# Patient Record
Sex: Male | Born: 1955
Health system: Southern US, Community
[De-identification: ages and names within clinical notes are randomized; demographics above are authoritative.]

## PROBLEM LIST (undated history)

## (undated) DIAGNOSIS — F419 Anxiety disorder, unspecified: Secondary | ICD-10-CM

## (undated) DIAGNOSIS — H9190 Unspecified hearing loss, unspecified ear: Secondary | ICD-10-CM

## (undated) DIAGNOSIS — C801 Malignant (primary) neoplasm, unspecified: Secondary | ICD-10-CM

## (undated) HISTORY — DX: Anxiety disorder, unspecified: F41.9

## (undated) HISTORY — PX: TONSILLECTOMY: SHX5217

## (undated) HISTORY — DX: Unspecified hearing loss, unspecified ear: H91.90

## (undated) HISTORY — DX: Malignant (primary) neoplasm, unspecified: C80.1

## (undated) HISTORY — PX: APPENDECTOMY: SHX54

## (undated) HISTORY — PX: EYE SURGERY: SHX253

---

## 1999-03-20 ENCOUNTER — Ambulatory Visit (HOSPITAL_COMMUNITY): Admission: RE | Admit: 1999-03-20 | Discharge: 1999-03-20 | Payer: Self-pay | Admitting: Neurosurgery

## 1999-03-20 ENCOUNTER — Encounter: Payer: Self-pay | Admitting: Neurosurgery

## 1999-03-24 ENCOUNTER — Inpatient Hospital Stay (HOSPITAL_COMMUNITY): Admission: RE | Admit: 1999-03-24 | Discharge: 1999-03-28 | Payer: Self-pay | Admitting: Neurosurgery

## 1999-05-18 ENCOUNTER — Ambulatory Visit (HOSPITAL_COMMUNITY): Admission: RE | Admit: 1999-05-18 | Discharge: 1999-05-18 | Payer: Self-pay

## 1999-05-22 ENCOUNTER — Encounter: Payer: Self-pay | Admitting: Neurosurgery

## 1999-05-22 ENCOUNTER — Ambulatory Visit (HOSPITAL_COMMUNITY): Admission: RE | Admit: 1999-05-22 | Discharge: 1999-05-22 | Payer: Self-pay | Admitting: Neurosurgery

## 1999-06-01 ENCOUNTER — Encounter: Admission: RE | Admit: 1999-06-01 | Discharge: 1999-08-30 | Payer: Self-pay | Admitting: Radiation Oncology

## 1999-09-05 ENCOUNTER — Ambulatory Visit (HOSPITAL_COMMUNITY): Admission: RE | Admit: 1999-09-05 | Discharge: 1999-09-05 | Payer: Self-pay | Admitting: Neurosurgery

## 1999-09-05 ENCOUNTER — Encounter: Payer: Self-pay | Admitting: Neurosurgery

## 1999-11-13 ENCOUNTER — Ambulatory Visit (HOSPITAL_COMMUNITY): Admission: RE | Admit: 1999-11-13 | Discharge: 1999-11-13 | Payer: Self-pay | Admitting: Neurosurgery

## 1999-11-13 ENCOUNTER — Encounter: Payer: Self-pay | Admitting: Neurosurgery

## 1999-11-28 ENCOUNTER — Encounter: Admission: RE | Admit: 1999-11-28 | Discharge: 1999-11-28 | Payer: Self-pay | Admitting: Neurosurgery

## 1999-11-28 ENCOUNTER — Encounter: Payer: Self-pay | Admitting: Neurosurgery

## 2000-01-11 ENCOUNTER — Ambulatory Visit (HOSPITAL_COMMUNITY): Admission: RE | Admit: 2000-01-11 | Discharge: 2000-01-11 | Payer: Self-pay | Admitting: Neurosurgery

## 2000-01-11 ENCOUNTER — Encounter: Payer: Self-pay | Admitting: Neurosurgery

## 2000-04-10 ENCOUNTER — Ambulatory Visit (HOSPITAL_COMMUNITY): Admission: RE | Admit: 2000-04-10 | Discharge: 2000-04-10 | Payer: Self-pay | Admitting: Neurosurgery

## 2000-04-10 ENCOUNTER — Encounter: Payer: Self-pay | Admitting: Neurosurgery

## 2000-10-14 ENCOUNTER — Encounter: Payer: Self-pay | Admitting: Neurosurgery

## 2000-10-14 ENCOUNTER — Ambulatory Visit: Admission: RE | Admit: 2000-10-14 | Discharge: 2000-10-14 | Payer: Self-pay | Admitting: Neurosurgery

## 2001-03-31 ENCOUNTER — Encounter: Payer: Self-pay | Admitting: Neurosurgery

## 2001-03-31 ENCOUNTER — Ambulatory Visit (HOSPITAL_COMMUNITY): Admission: RE | Admit: 2001-03-31 | Discharge: 2001-03-31 | Payer: Self-pay | Admitting: Neurosurgery

## 2006-07-10 ENCOUNTER — Ambulatory Visit (HOSPITAL_BASED_OUTPATIENT_CLINIC_OR_DEPARTMENT_OTHER): Admission: RE | Admit: 2006-07-10 | Discharge: 2006-07-10 | Payer: Self-pay | Admitting: Ophthalmology

## 2010-01-20 ENCOUNTER — Encounter: Admission: RE | Admit: 2010-01-20 | Discharge: 2010-01-20 | Payer: Self-pay | Admitting: Neurosurgery

## 2010-05-05 ENCOUNTER — Encounter: Admission: RE | Admit: 2010-05-05 | Discharge: 2010-05-05 | Payer: Self-pay | Admitting: Neurosurgery

## 2011-01-12 ENCOUNTER — Other Ambulatory Visit: Payer: Self-pay | Admitting: Dermatology

## 2011-01-12 NOTE — Op Note (Signed)
NAMEEDUARD, PENKALA               ACCOUNT NO.:  [id number]   MEDICAL RECORD NO.:  [id number]          PATIENT TYPE:  AMB   LOCATION:  [facility]                         FACILITY:  [facility]   PHYSICIAN:  Tyrone Apple. Karleen Hampshire, M.D.DATE OF BIRTH:  09-17-1955   DATE OF PROCEDURE:  07/10/2006  DATE OF DISCHARGE:                                 OPERATIVE REPORT   PREOPERATIVE DIAGNOSIS:  Status post multiple cranial nerve palsy secondary  to a cavernous sinus meningioma, which was treated with the Gamma Knife  excision.  The patient was left with a residual right III nerve paresis,  right IV nerve paresis, and right V nerve paresis.  The V and VII nerve  paresis have resolved.  Patient has residual right III and IV nerve paresis  with diplopia,  and a persistent tarsorrhaphy in place, approximately three  years post the initial placement.  This procedure was indicated to release  the tarsorrhaphy secondary to return of lid function and corneal sensitivity  with good tear production.  Also the procedure is indicated to improve  binocular fixation and extend the range of single binocular vision in the  primary position and in reading positions.  The risks and benefits of the  procedure were explained to the patient prior to the procedure.  Informed  consent was obtained.   DESCRIPTION OF TECHNIQUE:  Patient was taken into the operating room and  placed in a supine position.  The entire face was prepped and draped in the  usual sterile manner.  After the induction of general anesthesia and  establishment of a laryngeal mask airway, my attention was first directed to  the tarsorrhaphy.  The lid speculum was placed, and the fusion of the  tarsorrhaphy was transected with sharp dissection, that is posterior lamella  and the conjoined anterior lamellar layers were transected down to the  tarsal plate, carrying the dissections distally and laterally into the  appropriate area of the lateral canthus.   Hemostasis was achieved with  bipolar cautery.  A  8-0 Vicryl was then placed in the extreme lateral  canthus to reconstruct the lateral canthus.  Next, our attention was turned  to the right superior oblique.  Forced duction tests were performed and  found to be negative.  The globe was held in the superior temporal quadrant.  The eye was depressed and adducted.  An incision was made through the  superior temporal fornix, taken down to the posterior sub-tenon space.  The  right superior rectus tendon was then isolated on a Stevens hook and  subsequently on a Green hook.  This was used to hold the globe in an  elevated and adducted position.  Next, the superior oblique tendon was then  carefully identified beneath the superior rectus tendon.  It was found to be  atrophic, and it was isolated on a Stevens hook and subsequently on a Green  hook, and a mark was then placed on the tendon at 7 mm from its insertion  and the tendon was then imbricated on 6-0 Vicryl suture at the preplaced  marks.  A 6  mm tuck of the superior rectus tendon was then performed.  The  conjunctivae was repositioned.  A Steri-Strip was placed over the right eye,  and my attention was then directed to the fellow left eye.  A lid speculum  was placed.  Forced duction tests were performed and found to be negative.  The globe was then held in the inferior temporal quadrant.  The eye was  elevated and adducted.  An incision was made through the inferior temporal  fornix, taken down to the posterior sub-tenons space.  The left inferior  rectus tendon was then isolated on a Stevens hook and subsequently on a  Green hook.  It was then carefully dissected free from its overlying muscle  fascia and intermuscular septum and transected for a distance of  approximately 18 mm.  The capsular palpebral fascia was also dissected free  from the tendon, being careful to avoid the adjacent vortex veins.  A mark  was then placed on the  tendon at 15 mm from its insertion.  It was then  carefully imbricated on a 6-0 Vicryl suture at this point, and a 3 mm  transscleral fixation suture was then placed at the temporal and medial  aspects of the tendon, incorporating the sclera, thereby performing a  posterior fixation suture.  The conjunctivae was then  repositioned, and my attention was then directed to the left medial rectus  tendon, where an identical left medial rectus posterior fixation suture was  placed at the 15 mm mark.  At the conclusion of the procedure, TobraDex  ointment was instilled in the free fornices of both eyes.  There were no  apparent complications.      Casimiro Needle A. Karleen Hampshire, M.D.  Electronically Signed     MAS/MEDQ  D:  07/10/2006  T:  07/10/2006  Job:  [id number]

## 2011-03-02 ENCOUNTER — Other Ambulatory Visit: Payer: Self-pay | Admitting: Neurosurgery

## 2011-03-02 DIAGNOSIS — M47812 Spondylosis without myelopathy or radiculopathy, cervical region: Secondary | ICD-10-CM

## 2011-03-02 DIAGNOSIS — M25519 Pain in unspecified shoulder: Secondary | ICD-10-CM

## 2011-03-02 DIAGNOSIS — M542 Cervicalgia: Secondary | ICD-10-CM

## 2011-03-09 ENCOUNTER — Ambulatory Visit
Admission: RE | Admit: 2011-03-09 | Discharge: 2011-03-09 | Disposition: A | Discharge status: Home / Self Care | Payer: 59 | Source: Ambulatory Visit | Attending: Neurosurgery | Admitting: Neurosurgery

## 2011-03-09 DIAGNOSIS — M25519 Pain in unspecified shoulder: Secondary | ICD-10-CM

## 2011-03-09 DIAGNOSIS — M542 Cervicalgia: Secondary | ICD-10-CM

## 2011-03-09 DIAGNOSIS — M47812 Spondylosis without myelopathy or radiculopathy, cervical region: Secondary | ICD-10-CM

## 2011-03-09 MED ORDER — METHYLPREDNISOLONE ACETATE 40 MG/ML INJ SUSP (RADIOLOG
120.0000 mg | Freq: Once | INTRAMUSCULAR | Status: AC
  Administered 2011-03-09: 120 mg via EPIDURAL

## 2011-03-09 MED ORDER — IOHEXOL 180 MG/ML  SOLN
1.0000 mL | Freq: Once | INTRAMUSCULAR | Status: AC | PRN
  Administered 2011-03-09: 1 mL via EPIDURAL

## 2011-06-01 ENCOUNTER — Other Ambulatory Visit: Payer: Self-pay | Admitting: Dermatology

## 2011-12-12 ENCOUNTER — Other Ambulatory Visit: Payer: Self-pay | Admitting: Neurosurgery

## 2011-12-12 DIAGNOSIS — D496 Neoplasm of unspecified behavior of brain: Secondary | ICD-10-CM

## 2011-12-14 ENCOUNTER — Ambulatory Visit
Admission: RE | Admit: 2011-12-14 | Discharge: 2011-12-14 | Disposition: A | Discharge status: Home / Self Care | Payer: 59 | Source: Ambulatory Visit | Attending: Neurosurgery | Admitting: Neurosurgery

## 2011-12-14 VITALS — BP 148/92 | HR 68

## 2011-12-14 DIAGNOSIS — D496 Neoplasm of unspecified behavior of brain: Secondary | ICD-10-CM

## 2011-12-14 DIAGNOSIS — M5020 Other cervical disc displacement, unspecified cervical region: Secondary | ICD-10-CM

## 2011-12-14 MED ORDER — TRIAMCINOLONE ACETONIDE 40 MG/ML IJ SUSP (RADIOLOGY)
60.0000 mg | Freq: Once | INTRAMUSCULAR | Status: AC
  Administered 2011-12-14: 60 mg via EPIDURAL

## 2011-12-14 MED ORDER — IOHEXOL 300 MG/ML  SOLN
1.0000 mL | Freq: Once | INTRAMUSCULAR | Status: AC | PRN
  Administered 2011-12-14: 1 mL via EPIDURAL

## 2012-01-28 ENCOUNTER — Other Ambulatory Visit: Payer: Self-pay | Admitting: Neurosurgery

## 2012-01-28 DIAGNOSIS — D496 Neoplasm of unspecified behavior of brain: Secondary | ICD-10-CM

## 2012-01-29 ENCOUNTER — Other Ambulatory Visit: Payer: Self-pay | Admitting: Neurosurgery

## 2012-01-29 DIAGNOSIS — D496 Neoplasm of unspecified behavior of brain: Secondary | ICD-10-CM

## 2012-02-04 ENCOUNTER — Ambulatory Visit
Admission: RE | Admit: 2012-02-04 | Discharge: 2012-02-04 | Disposition: A | Discharge status: Home / Self Care | Payer: 59 | Source: Ambulatory Visit | Attending: Neurosurgery | Admitting: Neurosurgery

## 2012-02-04 DIAGNOSIS — D496 Neoplasm of unspecified behavior of brain: Secondary | ICD-10-CM

## 2012-02-04 MED ORDER — GADOBENATE DIMEGLUMINE 529 MG/ML IV SOLN
18.0000 mL | Freq: Once | INTRAVENOUS | Status: AC | PRN
  Administered 2012-02-04: 18 mL via INTRAVENOUS

## 2012-06-30 ENCOUNTER — Other Ambulatory Visit: Payer: Self-pay | Admitting: Neurosurgery

## 2012-06-30 DIAGNOSIS — D496 Neoplasm of unspecified behavior of brain: Secondary | ICD-10-CM

## 2012-07-11 ENCOUNTER — Ambulatory Visit
Admission: RE | Admit: 2012-07-11 | Discharge: 2012-07-11 | Disposition: A | Discharge status: Home / Self Care | Payer: 59 | Source: Ambulatory Visit | Attending: Neurosurgery | Admitting: Neurosurgery

## 2012-07-11 DIAGNOSIS — D496 Neoplasm of unspecified behavior of brain: Secondary | ICD-10-CM

## 2012-07-11 MED ORDER — IOHEXOL 300 MG/ML  SOLN
1.0000 mL | Freq: Once | INTRAMUSCULAR | Status: AC | PRN
  Administered 2012-07-11: 1 mL via EPIDURAL

## 2012-07-11 MED ORDER — TRIAMCINOLONE ACETONIDE 40 MG/ML IJ SUSP (RADIOLOGY)
60.0000 mg | Freq: Once | INTRAMUSCULAR | Status: AC
  Administered 2012-07-11: 60 mg via EPIDURAL

## 2012-11-10 ENCOUNTER — Other Ambulatory Visit: Payer: Self-pay | Admitting: Neurosurgery

## 2012-11-13 ENCOUNTER — Other Ambulatory Visit: Payer: Self-pay | Admitting: Neurosurgery

## 2012-11-13 DIAGNOSIS — Z139 Encounter for screening, unspecified: Secondary | ICD-10-CM

## 2012-11-17 ENCOUNTER — Ambulatory Visit
Admission: RE | Admit: 2012-11-17 | Discharge: 2012-11-17 | Disposition: A | Discharge status: Home / Self Care | Payer: 59 | Source: Ambulatory Visit | Attending: Neurosurgery | Admitting: Neurosurgery

## 2012-11-17 DIAGNOSIS — M259 Joint disorder, unspecified: Secondary | ICD-10-CM

## 2012-11-17 DIAGNOSIS — Z139 Encounter for screening, unspecified: Secondary | ICD-10-CM

## 2013-04-13 ENCOUNTER — Other Ambulatory Visit: Payer: Self-pay | Admitting: Neurosurgery

## 2013-04-13 DIAGNOSIS — M542 Cervicalgia: Secondary | ICD-10-CM

## 2013-04-14 ENCOUNTER — Other Ambulatory Visit: Payer: Self-pay | Admitting: Neurosurgery

## 2013-04-14 DIAGNOSIS — M542 Cervicalgia: Secondary | ICD-10-CM

## 2013-04-17 ENCOUNTER — Ambulatory Visit
Admission: RE | Admit: 2013-04-17 | Discharge: 2013-04-17 | Disposition: A | Discharge status: Home / Self Care | Payer: 59 | Source: Ambulatory Visit | Attending: Neurosurgery | Admitting: Neurosurgery

## 2013-04-17 ENCOUNTER — Other Ambulatory Visit: Payer: 59

## 2013-04-17 DIAGNOSIS — M542 Cervicalgia: Secondary | ICD-10-CM

## 2013-05-11 ENCOUNTER — Other Ambulatory Visit: Payer: Self-pay | Admitting: Neurosurgery

## 2013-05-11 DIAGNOSIS — M5020 Other cervical disc displacement, unspecified cervical region: Secondary | ICD-10-CM

## 2013-05-15 ENCOUNTER — Ambulatory Visit
Admission: RE | Admit: 2013-05-15 | Discharge: 2013-05-15 | Disposition: A | Discharge status: Home / Self Care | Payer: 59 | Source: Ambulatory Visit | Attending: Neurosurgery | Admitting: Neurosurgery

## 2013-05-15 VITALS — BP 158/89 | HR 85

## 2013-05-15 DIAGNOSIS — M5020 Other cervical disc displacement, unspecified cervical region: Secondary | ICD-10-CM

## 2013-05-15 MED ORDER — TRIAMCINOLONE ACETONIDE 40 MG/ML IJ SUSP (RADIOLOGY)
60.0000 mg | Freq: Once | INTRAMUSCULAR | Status: AC
  Administered 2013-05-15: 60 mg via EPIDURAL

## 2013-05-15 MED ORDER — IOHEXOL 300 MG/ML  SOLN
1.0000 mL | Freq: Once | INTRAMUSCULAR | Status: AC | PRN
  Administered 2013-05-15: 1 mL via EPIDURAL

## 2013-08-31 ENCOUNTER — Ambulatory Visit (INDEPENDENT_AMBULATORY_CARE_PROVIDER_SITE_OTHER): Payer: 59 | Admitting: General Practice

## 2013-08-31 ENCOUNTER — Telehealth: Payer: Self-pay | Admitting: Family Medicine

## 2013-08-31 ENCOUNTER — Encounter: Payer: Self-pay | Admitting: General Practice

## 2013-08-31 VITALS — BP 124/72 | HR 84 | Temp 98.7°F | Ht 72.0 in | Wt 187.0 lb

## 2013-08-31 DIAGNOSIS — J019 Acute sinusitis, unspecified: Secondary | ICD-10-CM

## 2013-08-31 DIAGNOSIS — R05 Cough: Secondary | ICD-10-CM

## 2013-08-31 DIAGNOSIS — R059 Cough, unspecified: Secondary | ICD-10-CM

## 2013-08-31 MED ORDER — BENZONATATE 100 MG PO CAPS: 100.0000 mg | ORAL_CAPSULE | Freq: Three times a day (TID) | ORAL | Status: DC | PRN

## 2013-08-31 MED ORDER — AZITHROMYCIN 250 MG PO TABS: ORAL_TABLET | ORAL | Status: DC

## 2013-08-31 NOTE — Progress Notes (Signed)
   Subjective:    Patient ID: Frank Hayden, male    DOB: 1955-12-16, 58 y.o.   MRN: [id number]  Sinusitis This is a new problem. The current episode started in the past 7 days. The problem is unchanged. There has been no fever. Associated symptoms include congestion, coughing and sinus pressure. Pertinent negatives include no sore throat. Past treatments include oral decongestants. The treatment provided mild relief.      Review of Systems  HENT: Positive for congestion, postnasal drip and sinus pressure. Negative for sore throat.   Respiratory: Positive for cough. Negative for chest tightness and wheezing.   Cardiovascular: Negative for chest pain and palpitations.  All other systems reviewed and are negative.       Objective:   Physical Exam  Constitutional: He appears well-developed.  HENT:  Head: Normocephalic and atraumatic.  Right Ear: External ear normal.  Left Ear: External ear normal.  Nose: Right sinus exhibits maxillary sinus tenderness and frontal sinus tenderness. Left sinus exhibits maxillary sinus tenderness and frontal sinus tenderness.  Mouth/Throat: Oropharynx is clear and moist. No posterior oropharyngeal erythema.          Assessment & Plan:  1. Sinusitis, acute  - azithromycin (ZITHROMAX) 250 MG tablet; Take as directed  Dispense: 6 tablet; Refill: 0  2. Cough  - benzonatate (TESSALON) 100 MG capsule; Take 1 capsule (100 mg total) by mouth 3 (three) times daily as needed for cough.  Dispense: 30 capsule; Refill: 0 -avoid irritants -RTO if symptoms worsen or unresolved Patient verbalized understanding Erby Pian, FNP-C

## 2013-08-31 NOTE — Telephone Encounter (Signed)
appt given for today 

## 2013-08-31 NOTE — Patient Instructions (Signed)

## 2013-12-03 ENCOUNTER — Other Ambulatory Visit: Payer: Self-pay | Admitting: Neurosurgery

## 2013-12-03 DIAGNOSIS — M5020 Other cervical disc displacement, unspecified cervical region: Secondary | ICD-10-CM

## 2013-12-10 ENCOUNTER — Ambulatory Visit
Admission: RE | Admit: 2013-12-10 | Discharge: 2013-12-10 | Disposition: A | Discharge status: Home / Self Care | Payer: 59 | Source: Ambulatory Visit | Attending: Neurosurgery | Admitting: Neurosurgery

## 2013-12-10 DIAGNOSIS — M5020 Other cervical disc displacement, unspecified cervical region: Secondary | ICD-10-CM

## 2013-12-10 MED ORDER — IOHEXOL 300 MG/ML  SOLN
1.0000 mL | Freq: Once | INTRAMUSCULAR | Status: AC | PRN
  Administered 2013-12-10: 1 mL via EPIDURAL

## 2013-12-10 MED ORDER — TRIAMCINOLONE ACETONIDE 40 MG/ML IJ SUSP (RADIOLOGY)
60.0000 mg | Freq: Once | INTRAMUSCULAR | Status: AC
  Administered 2013-12-10: 60 mg via EPIDURAL

## 2014-06-02 ENCOUNTER — Other Ambulatory Visit: Payer: Self-pay | Admitting: Neurosurgery

## 2014-06-02 DIAGNOSIS — D496 Neoplasm of unspecified behavior of brain: Secondary | ICD-10-CM

## 2014-06-11 ENCOUNTER — Ambulatory Visit
Admission: RE | Admit: 2014-06-11 | Discharge: 2014-06-11 | Disposition: A | Discharge status: Home / Self Care | Payer: 59 | Source: Ambulatory Visit | Attending: Neurosurgery | Admitting: Neurosurgery

## 2014-06-11 DIAGNOSIS — D496 Neoplasm of unspecified behavior of brain: Secondary | ICD-10-CM

## 2014-06-11 MED ORDER — GADOBENATE DIMEGLUMINE 529 MG/ML IV SOLN
8.0000 mL | Freq: Once | INTRAVENOUS | Status: AC | PRN
  Administered 2014-06-11: 8 mL via INTRAVENOUS

## 2014-08-12 DIAGNOSIS — D329 Benign neoplasm of meninges, unspecified: Secondary | ICD-10-CM | POA: Insufficient documentation

## 2014-12-14 ENCOUNTER — Ambulatory Visit (INDEPENDENT_AMBULATORY_CARE_PROVIDER_SITE_OTHER): Payer: BLUE CROSS/BLUE SHIELD | Admitting: Family Medicine

## 2014-12-14 ENCOUNTER — Encounter: Payer: Self-pay | Admitting: Family Medicine

## 2014-12-14 VITALS — BP 126/83 | HR 64 | Temp 97.1°F | Ht 72.0 in | Wt 189.0 lb

## 2014-12-14 DIAGNOSIS — L570 Actinic keratosis: Secondary | ICD-10-CM | POA: Diagnosis not present

## 2014-12-14 DIAGNOSIS — J329 Chronic sinusitis, unspecified: Secondary | ICD-10-CM

## 2014-12-14 DIAGNOSIS — J302 Other seasonal allergic rhinitis: Secondary | ICD-10-CM

## 2014-12-14 MED ORDER — AMOXICILLIN 500 MG PO CAPS: 500.0000 mg | ORAL_CAPSULE | Freq: Three times a day (TID) | ORAL | Status: DC

## 2014-12-14 NOTE — Patient Instructions (Signed)
Use the Flonase regularly 1-2 sprays each nostril at bedtime Take antibiotic as directed Drink plenty of fluids Use saline nose spray frequently through the day We will arrange for you to have an appointment with the dermatologist to look at the skin lesions on her face

## 2014-12-14 NOTE — Progress Notes (Signed)
Subjective:    Patient ID: Frank Hayden, male    DOB: 1956/03/09, 59 y.o.   MRN: [id number]  HPI Patient here today for sinus and allergies and he also has a skin lesion on his right eyebrow. He is also concerned about multiple skin lesions on his face. In January he had gamma knife surgery at [facility] for a recurrent brain tumor. He is doing well with this currently and does not have to go back to see them for 6 months.      There are no active problems to display for this patient.  Outpatient Encounter Prescriptions as of 12/14/2014  Medication Sig  . ALPRAZolam (XANAX) 1 MG tablet Take 1 mg by mouth as needed for anxiety.  . cycloSPORINE (RESTASIS) 0.05 % ophthalmic emulsion 1 drop 2 (two) times daily.  . fluticasone (FLONASE) 50 MCG/ACT nasal spray Place into both nostrils daily.  . [DISCONTINUED] azithromycin (ZITHROMAX) 250 MG tablet Take as directed  . [DISCONTINUED] benzonatate (TESSALON) 100 MG capsule Take 1 capsule (100 mg total) by mouth 3 (three) times daily as needed for cough.    Review of Systems  Constitutional: Negative.   HENT: Positive for sinus pressure.        Sinus and allergies  Eyes: Negative.   Respiratory: Negative.   Cardiovascular: Negative.   Gastrointestinal: Negative.   Endocrine: Negative.   Genitourinary: Negative.   Musculoskeletal: Negative.   Skin: Negative.        Skin lesion - right eyebrow  Allergic/Immunologic: Negative.   Neurological: Negative.   Hematological: Negative.   Psychiatric/Behavioral: Negative.        Objective:   Physical Exam  Constitutional: He is oriented to person, place, and time. He appears well-developed and well-nourished. No distress.  HENT:  Head: Normocephalic and atraumatic.  Right Ear: External ear normal.  Left Ear: External ear normal.  Mouth/Throat: Oropharynx is clear and moist. No oropharyngeal exudate.  Nasal congestion and erythema and turbinate congestion  bilaterally  Eyes: Conjunctivae and EOM are normal. Pupils are equal, round, and reactive to light. Right eye exhibits no discharge. Left eye exhibits no discharge.  Neck: Normal range of motion. Neck supple. No thyromegaly present.  Cardiovascular: Normal rate, regular rhythm and normal heart sounds.   No murmur heard. Pulmonary/Chest: Effort normal and breath sounds normal. No respiratory distress. He has no wheezes. He has no rales.  Dry cough no wheezes or rales  Musculoskeletal: Normal range of motion.  Lymphadenopathy:    He has no cervical adenopathy.  Neurological: He is alert and oriented to person, place, and time.  Skin: Skin is warm and dry. No rash noted. No erythema. No pallor.  The patient has multiple actinic keratoses on his face  Psychiatric: He has a normal mood and affect. His behavior is normal. Judgment and thought content normal.  Nursing note and vitals reviewed.  BP 126/83 mmHg  Pulse 64  Temp(Src) 97.1 F (36.2 C) (Oral)  Ht 6' (1.829 m)  Wt 189 lb (85.73 kg)  BMI 25.63 kg/m2        Assessment & Plan:  1. Other seasonal allergic rhinitis -Use Flonase regularly and take antibiotic as directed -Also drink plenty of fluids and use nasal saline  2. Multiple actinic keratoses -We will arrange for you to have an appointment to see the dermatologist regarding the multiple actinic keratoses on your face  3. Rhinosinusitis -Take antibiotic as directed  Meds ordered this encounter  Medications  .  cycloSPORINE (RESTASIS) 0.05 % ophthalmic emulsion    Sig: 1 drop 2 (two) times daily.  Marland Kitchen amoxicillin (AMOXIL) 500 MG capsule    Sig: Take 1 capsule (500 mg total) by mouth 3 (three) times daily.    Dispense:  30 capsule    Refill:  0   Patient Instructions  Use the Flonase regularly 1-2 sprays each nostril at bedtime Take antibiotic as directed Drink plenty of fluids Use saline nose spray frequently through the day We will arrange for you to have an  appointment with the dermatologist to look at the skin lesions on her face   Arrie Senate MD

## 2014-12-20 ENCOUNTER — Other Ambulatory Visit: Payer: Self-pay | Admitting: Family Medicine

## 2014-12-22 ENCOUNTER — Other Ambulatory Visit: Payer: Self-pay | Admitting: Family Medicine

## 2015-06-17 ENCOUNTER — Other Ambulatory Visit: Payer: Self-pay | Admitting: Neurosurgery

## 2015-06-17 DIAGNOSIS — M542 Cervicalgia: Secondary | ICD-10-CM

## 2015-07-04 ENCOUNTER — Ambulatory Visit
Admission: RE | Admit: 2015-07-04 | Discharge: 2015-07-04 | Disposition: A | Discharge status: Home / Self Care | Payer: BLUE CROSS/BLUE SHIELD | Source: Ambulatory Visit | Attending: Neurosurgery | Admitting: Neurosurgery

## 2015-07-04 DIAGNOSIS — M542 Cervicalgia: Secondary | ICD-10-CM

## 2015-07-04 MED ORDER — TRIAMCINOLONE ACETONIDE 40 MG/ML IJ SUSP (RADIOLOGY)
60.0000 mg | Freq: Once | INTRAMUSCULAR | Status: AC
  Administered 2015-07-04: 60 mg via EPIDURAL

## 2015-07-04 MED ORDER — IOHEXOL 300 MG/ML  SOLN
1.0000 mL | Freq: Once | INTRAMUSCULAR | Status: DC | PRN
  Administered 2015-07-04: 1 mL via EPIDURAL

## 2015-07-04 NOTE — Discharge Instructions (Signed)

## 2016-06-27 ENCOUNTER — Other Ambulatory Visit: Payer: Self-pay | Admitting: Nurse Practitioner

## 2016-06-27 DIAGNOSIS — M5020 Other cervical disc displacement, unspecified cervical region: Secondary | ICD-10-CM

## 2016-08-03 ENCOUNTER — Ambulatory Visit
Admission: RE | Admit: 2016-08-03 | Discharge: 2016-08-03 | Disposition: A | Discharge status: Home / Self Care | Payer: Managed Care, Other (non HMO) | Source: Ambulatory Visit | Attending: Nurse Practitioner | Admitting: Nurse Practitioner

## 2016-08-03 DIAGNOSIS — M5020 Other cervical disc displacement, unspecified cervical region: Secondary | ICD-10-CM

## 2016-08-03 MED ORDER — TRIAMCINOLONE ACETONIDE 40 MG/ML IJ SUSP (RADIOLOGY)
60.0000 mg | Freq: Once | INTRAMUSCULAR | Status: AC
  Administered 2016-08-03: 60 mg via EPIDURAL

## 2016-08-03 MED ORDER — IOHEXOL 300 MG/ML  SOLN
1.0000 mL | Freq: Once | INTRAMUSCULAR | Status: AC | PRN
Start: 2016-08-03 — End: 2016-08-03
  Administered 2016-08-03: 1 mL via EPIDURAL

## 2016-08-03 NOTE — Discharge Instructions (Signed)

## 2017-01-23 ENCOUNTER — Encounter: Payer: Self-pay | Admitting: Pediatrics

## 2017-01-23 ENCOUNTER — Ambulatory Visit (INDEPENDENT_AMBULATORY_CARE_PROVIDER_SITE_OTHER): Payer: Managed Care, Other (non HMO) | Admitting: Pediatrics

## 2017-01-23 ENCOUNTER — Ambulatory Visit: Payer: BLUE CROSS/BLUE SHIELD | Admitting: Pediatrics

## 2017-01-23 VITALS — BP 123/78 | HR 68 | Temp 99.2°F | Ht 73.0 in | Wt 182.8 lb

## 2017-01-23 DIAGNOSIS — D329 Benign neoplasm of meninges, unspecified: Secondary | ICD-10-CM

## 2017-01-23 DIAGNOSIS — W57XXXA Bitten or stung by nonvenomous insect and other nonvenomous arthropods, initial encounter: Secondary | ICD-10-CM

## 2017-01-23 DIAGNOSIS — S30860A Insect bite (nonvenomous) of lower back and pelvis, initial encounter: Secondary | ICD-10-CM | POA: Diagnosis not present

## 2017-01-23 NOTE — Progress Notes (Signed)
Subjective:   Patient ID: Frank Hayden, male    DOB: 10-27-1955, 61 y.o.   MRN: [id number] CC: tick bites (lower back x 1 1/2 weeks ago)  HPI: Frank Hayden is a 61 y.o. male presenting for tick bites (lower back x 1 1/2 weeks ago)  Hasnt been seen in clinic since 2015  Tick bite: 1.5 weeks ago Itching some Took off soon after finding it hasnt put anything on it No rash, no fever, no muscle aches Feeling his normal self  Meningioma: S/p resection of most but not all Follows with NSG, recent MRI stable, next due in 2.5 yrs Has chronic burning feeling in his forehead Was started on lyrica, caused him to think slower, also started having red patchy rash in a couple places on his body with itching Stopped lyrica 2 weeks ago Has f/u in 2 mo with phone call f/u next week Also has been tried on cymbalta in the past, didn't help Not taking anymore  Low back pain: Follows with Dr Carloyn Manner  Past Medical History:  Diagnosis Date  . Anxiety    Has headaches associated with past surgery.   Family History  Problem Relation Age of Onset  . Heart disease Mother        Heart enlarged and mechanical valve surgery.  . Hypertension Mother   . Heart disease Father        5 bypass  . Hypertension Father    Social History   Social History  . Marital status: Married    Spouse name: N/A  . Number of children: N/A  . Years of education: N/A   Occupational History  . Not on file.   Social History Main Topics  . Smoking status: Former Research scientist (life sciences)  . Smokeless tobacco: Not on file     Comment: quit 40 years ago  . Alcohol use No  . Drug use: No  . Sexual activity: Not on file   Other Topics Concern  . Not on file   Social History Narrative  . No narrative on file   ROS: All systems negative other than what is in HPI  Objective:    BP 123/78   Pulse 68   Temp 99.2 F (37.3 C) (Oral)   Ht 6\' 1"  (1.854 m)   Wt 182 lb 12.8 oz (82.9 kg)   BMI 24.12 kg/m   Wt Readings from  Last 3 Encounters:  01/23/17 182 lb 12.8 oz (82.9 kg)  12/14/14 189 lb (85.7 kg)  08/31/13 187 lb (84.8 kg)    Gen: NAD, alert, cooperative with exam, NCAT EYES: no conjunctival injection, or no icterus ENT:  OP without erythema LYMPH: no cervical LAD CV: NRRR, normal S1/S2, no murmur, distal pulses 2+ b/l Resp: CTABL, no wheezes, normal WOB Abd: +BS, soft, NTND.  Ext: No edema, warm Neuro: Alert and oriented Skin: red slightly raised scaly 0.5cm patch R lower ant thigh Similar size, lesion mid back R flank with 51mmx2mm red papule, nontender, no induration  Assessment & Plan:  Augustus was seen today for tick bites.  Diagnoses and all orders for this visit:  Tick bite, initial encounter No symptoms, happened 1.5 weeks ago, no rash, healing well Will ctm  Meningioma ([place]) Following with NSG Rash with recent start of lyrica, also caused slow thinking Has phone f/u upcoming per pt  Overdue for CPE  Follow up plan: Return in about 2 weeks (around 02/06/2017) for Complete physical, OK to schedule at [place],  MD [place]

## 2017-01-23 NOTE — Patient Instructions (Signed)
Can try hydrocortisone on itchy red spots

## 2017-02-11 ENCOUNTER — Ambulatory Visit (INDEPENDENT_AMBULATORY_CARE_PROVIDER_SITE_OTHER): Payer: Managed Care, Other (non HMO) | Admitting: Pediatrics

## 2017-02-11 ENCOUNTER — Encounter: Payer: Self-pay | Admitting: Pediatrics

## 2017-02-11 VITALS — BP 132/81 | HR 67 | Temp 97.0°F | Ht 73.0 in | Wt 180.6 lb

## 2017-02-11 DIAGNOSIS — Z0000 Encounter for general adult medical examination without abnormal findings: Secondary | ICD-10-CM | POA: Diagnosis not present

## 2017-02-11 DIAGNOSIS — W57XXXD Bitten or stung by nonvenomous insect and other nonvenomous arthropods, subsequent encounter: Secondary | ICD-10-CM

## 2017-02-11 MED ORDER — DOXYCYCLINE HYCLATE 100 MG PO TABS: 100.0000 mg | ORAL_TABLET | Freq: Two times a day (BID) | ORAL | 0 refills | Status: DC

## 2017-02-11 NOTE — Progress Notes (Signed)
  Subjective:   Patient ID: Frank Hayden, male    DOB: 11/15/1955, 61 y.o.   MRN: [id number] CC: Annual Exam and Insect Bite (6 since 4/28)  HPI: Frank Hayden is a 61 y.o. 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 Carloyn Manner apprx 2 times a week when forehead burning bothering him so much he cant sleep or wakes up and cant get back to seelp  No family history of colon ca MGM with breast ca  No prostate cancer  Relevant past medical, surgical, family and social history reviewed. Allergies and medications reviewed and updated. History  Smoking Status  . Former Smoker  Smokeless Tobacco  . Never Used    Comment: quit 40 years ago   ROS: All systems negative other than what is in HPI  Objective:    BP 132/81   Pulse 67   Temp 97 F (36.1 C) (Oral)   Ht '6\' 1"'$  (1.854 m)   Wt 180 lb 9.6 oz (81.9 kg)   BMI 23.83 kg/m   Wt Readings from Last 3 Encounters:  02/11/17 180 lb 9.6 oz (81.9 kg)  01/23/17 182 lb 12.8 oz (82.9 kg)  12/14/14 189 lb (85.7 kg)    Gen: NAD, alert, cooperative with exam, NCAT EYES: no conjunctival injection, or no icterus ENT:  TMs pearly gray b/l, OP without erythema LYMPH: no cervical LAD NECK: nl thyroid CV: NRRR, normal S1/S2, no murmur, distal pulses 2+ b/l Resp: CTABL,  no wheezes, normal WOB Abd: +BS, soft, NTND. no guarding or organomegaly Ext: No edema, warm Neuro: Alert and oriented Skin: red plaque L temple with some red crust on top  Assessment & Plan:  Issaac was seen today for annual exam and insect bite.  Diagnoses and all orders for this visit:  Encounter for preventive health examination -     Lipid panel -     Hepatitis C antibody -     CMP14+EGFR -     CBC with Differential/Platelet -     TSH  Tick bite, subsequent encounter Start below, return precautions discussed -     doxycycline (VIBRA-TABS) 100 MG tablet; Take 1 tablet (100 mg total) by mouth 2 (two) times daily.   Follow up plan: Next CPE due 1 yr, sooner if needed Assunta Found, MD [place]

## 2017-02-12 LAB — CBC WITH DIFFERENTIAL/PLATELET
BASOS: 0 %
Basophils Absolute: 0 10*3/uL (ref 0.0–0.2)
EOS (ABSOLUTE): 0.2 10*3/uL (ref 0.0–0.4)
EOS: 3 %
HEMATOCRIT: 46 % (ref 37.5–51.0)
Hemoglobin: 15.5 g/dL (ref 13.0–17.7)
IMMATURE GRANULOCYTES: 0 %
Immature Grans (Abs): 0 10*3/uL (ref 0.0–0.1)
LYMPHS ABS: 0.9 10*3/uL (ref 0.7–3.1)
Lymphs: 13 %
MCH: 29.8 pg (ref 26.6–33.0)
MCHC: 33.7 g/dL (ref 31.5–35.7)
MCV: 89 fL (ref 79–97)
MONOS ABS: 0.6 10*3/uL (ref 0.1–0.9)
Monocytes: 9 %
NEUTROS ABS: 4.9 10*3/uL (ref 1.4–7.0)
NEUTROS PCT: 75 %
PLATELETS: 171 10*3/uL (ref 150–379)
RBC: 5.2 x10E6/uL (ref 4.14–5.80)
RDW: 14 % (ref 12.3–15.4)
WBC: 6.5 10*3/uL (ref 3.4–10.8)

## 2017-02-12 LAB — LIPID PANEL
CHOL/HDL RATIO: 5.7 ratio — AB (ref 0.0–5.0)
Cholesterol, Total: 285 mg/dL — ABNORMAL HIGH (ref 100–199)
HDL: 50 mg/dL (ref >39)
LDL Calculated: 204 mg/dL — ABNORMAL HIGH (ref 0–99)
TRIGLYCERIDES: 153 mg/dL — AB (ref 0–149)
VLDL CHOLESTEROL CAL: 31 mg/dL (ref 5–40)

## 2017-02-12 LAB — CMP14+EGFR
A/G RATIO: 2.4 — AB (ref 1.2–2.2)
ALT: 16 IU/L (ref 0–44)
AST: 18 IU/L (ref 0–40)
Albumin: 4.5 g/dL (ref 3.6–4.8)
Alkaline Phosphatase: 57 IU/L (ref 39–117)
BUN / CREAT RATIO: 12 (ref 10–24)
BUN: 12 mg/dL (ref 8–27)
Bilirubin Total: 0.5 mg/dL (ref 0.0–1.2)
CALCIUM: 9.7 mg/dL (ref 8.6–10.2)
CO2: 27 mmol/L (ref 20–29)
CREATININE: 1.01 mg/dL (ref 0.76–1.27)
Chloride: 99 mmol/L (ref 96–106)
GFR, EST AFRICAN AMERICAN: 92 mL/min/{1.73_m2} (ref >59)
GFR, EST NON AFRICAN AMERICAN: 80 mL/min/{1.73_m2} (ref >59)
GLOBULIN, TOTAL: 1.9 g/dL (ref 1.5–4.5)
Glucose: 98 mg/dL (ref 65–99)
POTASSIUM: 5.2 mmol/L (ref 3.5–5.2)
SODIUM: 140 mmol/L (ref 134–144)
TOTAL PROTEIN: 6.4 g/dL (ref 6.0–8.5)

## 2017-02-12 LAB — HEPATITIS C ANTIBODY: Hep C Virus Ab: <0.1 s/co ratio (ref 0.0–0.9)

## 2017-02-12 LAB — TSH: TSH: 1.43 u[IU]/mL (ref 0.450–4.500)

## 2017-02-14 ENCOUNTER — Telehealth: Payer: Self-pay | Admitting: *Deleted

## 2017-02-14 MED ORDER — ATORVASTATIN CALCIUM 40 MG PO TABS: 40.0000 mg | ORAL_TABLET | Freq: Every day | ORAL | 5 refills | Status: DC

## 2017-02-14 NOTE — Telephone Encounter (Signed)
Attempted to contact patient with lab results.  Per Dr. Evette Doffing cholesterol is elevated and needs to start a cholesterol medication.  Lipitor 40mg  has been sent to pharmacy.

## 2017-03-08 ENCOUNTER — Other Ambulatory Visit: Payer: Self-pay | Admitting: *Deleted

## 2017-03-08 MED ORDER — ATORVASTATIN CALCIUM 40 MG PO TABS: 40.0000 mg | ORAL_TABLET | Freq: Every day | ORAL | 4 refills | Status: DC

## 2017-04-25 ENCOUNTER — Encounter: Payer: Self-pay | Admitting: Family Medicine

## 2017-04-25 ENCOUNTER — Ambulatory Visit (INDEPENDENT_AMBULATORY_CARE_PROVIDER_SITE_OTHER): Payer: Managed Care, Other (non HMO) | Admitting: Family Medicine

## 2017-04-25 VITALS — BP 123/74 | HR 75 | Temp 98.3°F | Ht 73.0 in | Wt 177.0 lb

## 2017-04-25 DIAGNOSIS — S80261A Insect bite (nonvenomous), right knee, initial encounter: Secondary | ICD-10-CM

## 2017-04-25 DIAGNOSIS — W57XXXA Bitten or stung by nonvenomous insect and other nonvenomous arthropods, initial encounter: Secondary | ICD-10-CM

## 2017-04-25 DIAGNOSIS — T148XXA Other injury of unspecified body region, initial encounter: Secondary | ICD-10-CM

## 2017-04-25 DIAGNOSIS — S90822A Blister (nonthermal), left foot, initial encounter: Secondary | ICD-10-CM | POA: Diagnosis not present

## 2017-04-25 NOTE — Progress Notes (Signed)
   HPI  Patient presents today here for acute visit.  Blister Performed on the left foot about one week ago. Patient denies any repetitive rubbing of the shoe or any reason that it seems to have come on. No fever, chills, sweats, no other blisters anywhere else.  Around the same time he developed multiple erythematous itchy papules on the bilateral legs Patient feels that he may have been exposed to fleas while cleaning out a house to get ready to sell.  Tick bite About one week ago, right popliteal fossa, removed within 2 hours. No lesion, no fever, chills, sweats, fatigue.  PMH: Smoking status noted ROS: Per HPI  Objective: BP 123/74   Pulse 75   Temp 98.3 F (36.8 C) (Oral)   Ht 6\' 1"  (1.854 m)   Wt 177 lb (80.3 kg)   BMI 23.35 kg/m  Gen: NAD, alert, cooperative with exam HEENT: Discordant gaze, ptosis on the right CV: RRR, good S1/S2, no murmur Resp: CTABL, no wheezes, non-labored Ext: No edema, warm Neuro: Alert and oriented, No gross deficits Skin Left foot with approximately 1 cm bulla, tense, no surrounding erythema, no drainage. Located on the dorsal distal left foot  20-40 erythematous papules on bilateral lower extremities more concentrated on the lower legs and extending onto the thighs bilaterally No areas of drainage, induration, or tenderness.  Assessment and plan:  # Arthropod bite Multiple papules on bilateral legs likely flea exposure Supportive care, he is doing quite well with calamine lotion plus cortisone 10  # Tick bite Low risk tick bite No lesion appreciated on right popliteal fossa Reassurance provided, low threshold for follow-up for any febrile illness  # Blister Left foot with likely usual blister caused by friction Low threshold for f/u if worsens or develops other lesions.     Frank Apple, MD [facility] 04/25/2017, 7:03 PM

## 2017-06-20 ENCOUNTER — Other Ambulatory Visit: Payer: Self-pay | Admitting: Dermatology

## 2018-06-20 DIAGNOSIS — J329 Chronic sinusitis, unspecified: Secondary | ICD-10-CM | POA: Insufficient documentation

## 2018-07-16 ENCOUNTER — Other Ambulatory Visit: Payer: Self-pay | Admitting: Neurosurgery

## 2018-07-16 DIAGNOSIS — M4712 Other spondylosis with myelopathy, cervical region: Secondary | ICD-10-CM

## 2018-07-30 ENCOUNTER — Ambulatory Visit
Admission: RE | Admit: 2018-07-30 | Discharge: 2018-07-30 | Disposition: A | Discharge status: Home / Self Care | Payer: Managed Care, Other (non HMO) | Source: Ambulatory Visit | Attending: Neurosurgery | Admitting: Neurosurgery

## 2018-07-30 DIAGNOSIS — M4712 Other spondylosis with myelopathy, cervical region: Secondary | ICD-10-CM

## 2018-07-30 MED ORDER — METHYLPREDNISOLONE ACETATE 40 MG/ML INJ SUSP (RADIOLOG
120.0000 mg | Freq: Once | INTRAMUSCULAR | Status: AC
  Administered 2018-07-30: 120 mg via EPIDURAL

## 2018-07-30 MED ORDER — IOPAMIDOL (ISOVUE-M 200) INJECTION 41%
1.0000 mL | Freq: Once | INTRAMUSCULAR | Status: AC
  Administered 2018-07-30: 1 mL via EPIDURAL

## 2018-07-30 NOTE — Discharge Instructions (Signed)

## 2018-09-19 DIAGNOSIS — Z6825 Body mass index (BMI) 25.0-25.9, adult: Secondary | ICD-10-CM | POA: Diagnosis not present

## 2018-09-19 DIAGNOSIS — M4802 Spinal stenosis, cervical region: Secondary | ICD-10-CM | POA: Insufficient documentation

## 2018-09-19 DIAGNOSIS — D329 Benign neoplasm of meninges, unspecified: Secondary | ICD-10-CM | POA: Diagnosis not present

## 2018-09-19 DIAGNOSIS — R030 Elevated blood-pressure reading, without diagnosis of hypertension: Secondary | ICD-10-CM | POA: Diagnosis not present

## 2018-10-03 DIAGNOSIS — H903 Sensorineural hearing loss, bilateral: Secondary | ICD-10-CM | POA: Insufficient documentation

## 2018-10-03 DIAGNOSIS — J00 Acute nasopharyngitis [common cold]: Secondary | ICD-10-CM | POA: Insufficient documentation

## 2018-10-17 DIAGNOSIS — J323 Chronic sphenoidal sinusitis: Secondary | ICD-10-CM | POA: Diagnosis not present

## 2018-10-20 DIAGNOSIS — H903 Sensorineural hearing loss, bilateral: Secondary | ICD-10-CM | POA: Diagnosis not present

## 2018-10-23 DIAGNOSIS — H9311 Tinnitus, right ear: Secondary | ICD-10-CM | POA: Diagnosis not present

## 2018-10-23 DIAGNOSIS — H9041 Sensorineural hearing loss, unilateral, right ear, with unrestricted hearing on the contralateral side: Secondary | ICD-10-CM | POA: Diagnosis not present

## 2018-10-30 DIAGNOSIS — H903 Sensorineural hearing loss, bilateral: Secondary | ICD-10-CM | POA: Diagnosis not present

## 2018-11-07 DIAGNOSIS — D329 Benign neoplasm of meninges, unspecified: Secondary | ICD-10-CM | POA: Diagnosis not present

## 2018-11-07 DIAGNOSIS — H903 Sensorineural hearing loss, bilateral: Secondary | ICD-10-CM | POA: Diagnosis not present

## 2018-11-21 DIAGNOSIS — Z6826 Body mass index (BMI) 26.0-26.9, adult: Secondary | ICD-10-CM | POA: Diagnosis not present

## 2018-11-21 DIAGNOSIS — D329 Benign neoplasm of meninges, unspecified: Secondary | ICD-10-CM | POA: Diagnosis not present

## 2018-12-01 ENCOUNTER — Other Ambulatory Visit: Payer: Self-pay | Admitting: Neurological Surgery

## 2018-12-01 DIAGNOSIS — D329 Benign neoplasm of meninges, unspecified: Secondary | ICD-10-CM

## 2018-12-12 DIAGNOSIS — H168 Other keratitis: Secondary | ICD-10-CM | POA: Diagnosis not present

## 2018-12-12 DIAGNOSIS — H16231 Neurotrophic keratoconjunctivitis, right eye: Secondary | ICD-10-CM | POA: Diagnosis not present

## 2018-12-12 DIAGNOSIS — H538 Other visual disturbances: Secondary | ICD-10-CM | POA: Diagnosis not present

## 2018-12-12 DIAGNOSIS — H4720 Unspecified optic atrophy: Secondary | ICD-10-CM | POA: Diagnosis not present

## 2018-12-12 DIAGNOSIS — Z961 Presence of intraocular lens: Secondary | ICD-10-CM | POA: Diagnosis not present

## 2018-12-31 ENCOUNTER — Ambulatory Visit (HOSPITAL_COMMUNITY)
Admission: RE | Admit: 2018-12-31 | Discharge: 2018-12-31 | Disposition: A | Discharge status: Home / Self Care | Payer: BLUE CROSS/BLUE SHIELD | Source: Ambulatory Visit | Attending: Neurological Surgery | Admitting: Neurological Surgery

## 2018-12-31 ENCOUNTER — Other Ambulatory Visit: Payer: Self-pay

## 2018-12-31 DIAGNOSIS — D329 Benign neoplasm of meninges, unspecified: Secondary | ICD-10-CM | POA: Insufficient documentation

## 2018-12-31 LAB — CREATININE, SERUM
Creatinine, Ser: 0.92 mg/dL (ref 0.61–1.24)
GFR calc Af Amer: >60 mL/min (ref >60)
GFR calc non Af Amer: >60 mL/min (ref >60)

## 2018-12-31 MED ORDER — GADOBUTROL 1 MMOL/ML IV SOLN
8.0000 mL | Freq: Once | INTRAVENOUS | Status: AC | PRN
  Administered 2018-12-31: 8 mL via INTRAVENOUS

## 2019-01-02 ENCOUNTER — Ambulatory Visit (HOSPITAL_COMMUNITY): Payer: Managed Care, Other (non HMO)

## 2019-01-02 ENCOUNTER — Other Ambulatory Visit (HOSPITAL_COMMUNITY): Payer: Managed Care, Other (non HMO)

## 2019-01-15 DIAGNOSIS — L308 Other specified dermatitis: Secondary | ICD-10-CM | POA: Diagnosis not present

## 2019-01-15 DIAGNOSIS — C44222 Squamous cell carcinoma of skin of right ear and external auricular canal: Secondary | ICD-10-CM | POA: Diagnosis not present

## 2019-01-20 DIAGNOSIS — C44222 Squamous cell carcinoma of skin of right ear and external auricular canal: Secondary | ICD-10-CM | POA: Diagnosis not present

## 2019-02-03 DIAGNOSIS — C44222 Squamous cell carcinoma of skin of right ear and external auricular canal: Secondary | ICD-10-CM | POA: Diagnosis not present

## 2019-04-17 DIAGNOSIS — Z961 Presence of intraocular lens: Secondary | ICD-10-CM | POA: Diagnosis not present

## 2019-04-17 DIAGNOSIS — H538 Other visual disturbances: Secondary | ICD-10-CM | POA: Diagnosis not present

## 2019-04-17 DIAGNOSIS — H16231 Neurotrophic keratoconjunctivitis, right eye: Secondary | ICD-10-CM | POA: Diagnosis not present

## 2019-04-17 DIAGNOSIS — H168 Other keratitis: Secondary | ICD-10-CM | POA: Diagnosis not present

## 2019-05-14 ENCOUNTER — Other Ambulatory Visit: Payer: Self-pay

## 2019-05-15 ENCOUNTER — Encounter: Payer: Self-pay | Admitting: Physician Assistant

## 2019-05-15 ENCOUNTER — Ambulatory Visit (INDEPENDENT_AMBULATORY_CARE_PROVIDER_SITE_OTHER): Payer: BC Managed Care – PPO | Admitting: Physician Assistant

## 2019-05-15 VITALS — BP 137/86 | HR 67 | Temp 97.1°F | Ht 73.0 in | Wt 192.2 lb

## 2019-05-15 DIAGNOSIS — D329 Benign neoplasm of meninges, unspecified: Secondary | ICD-10-CM

## 2019-05-15 DIAGNOSIS — J00 Acute nasopharyngitis [common cold]: Secondary | ICD-10-CM | POA: Diagnosis not present

## 2019-05-15 DIAGNOSIS — Z0001 Encounter for general adult medical examination with abnormal findings: Secondary | ICD-10-CM

## 2019-05-15 DIAGNOSIS — H903 Sensorineural hearing loss, bilateral: Secondary | ICD-10-CM | POA: Diagnosis not present

## 2019-05-15 DIAGNOSIS — F5101 Primary insomnia: Secondary | ICD-10-CM

## 2019-05-15 DIAGNOSIS — Z0000 Encounter for general adult medical examination without abnormal findings: Secondary | ICD-10-CM | POA: Insufficient documentation

## 2019-05-15 MED ORDER — ALPRAZOLAM 1 MG PO TABS: 1.0000 mg | ORAL_TABLET | Freq: Every day | ORAL | 5 refills | Status: DC

## 2019-05-15 NOTE — Patient Instructions (Signed)

## 2019-05-18 NOTE — Progress Notes (Signed)
BP 137/86   Pulse 67   Temp (!) 97.1 F (36.2 C) (Temporal)   Ht '6\' 1"'$  (1.854 m)   Wt 192 lb 3.2 oz (87.2 kg)   SpO2 96%   BMI 25.36 kg/m    Subjective:    Patient ID: Frank Hayden, male    DOB: 1956-01-27, 63 y.o.   MRN: [id number]  HPI: Frank Hayden is a 63 y.o. male presenting on 05/15/2019 for Annual Exam  This patient comes in to be established and for general well check.  He is currently under the care of Dr. Venetia Constable.  He had previously been with Dr. Carloyn Manner.  He did have a meningioma removed several years ago.  I have reviewed all of his records from before.  He did go through surgery and radiation to have it treated.  He has had a repeat of the tumor.  But the neurosurgeon at this time states it will not be nearly as difficult to perform.  So there is upcoming surgery.  I have reviewed all of his medications and he does need refills on several things.  He will also have labs performed.  And at this time he does not have any other new complaints.  Past Medical History:  Diagnosis Date  . Anxiety    Has headaches associated with past surgery.  . Cancer [facility])    Brain    Relevant past medical, surgical, family and social history reviewed and updated as indicated. Interim medical history since our last visit reviewed. Allergies and medications reviewed and updated. DATA REVIEWED: CHART IN EPIC  Family History reviewed for pertinent findings.  Review of Systems  Constitutional: Negative.  Negative for appetite change, fatigue, fever and unexpected weight change.  HENT: Negative.   Eyes: Positive for visual disturbance. Negative for pain.  Respiratory: Negative.  Negative for cough, chest tightness, shortness of breath and wheezing.   Cardiovascular: Negative.  Negative for chest pain, palpitations and leg swelling.  Gastrointestinal: Negative.  Negative for abdominal pain, diarrhea, nausea and vomiting.  Endocrine: Negative.   Genitourinary: Negative.    Musculoskeletal: Negative.   Skin: Negative.  Negative for color change and rash.  Neurological: Positive for headaches. Negative for tremors, weakness and numbness.  Psychiatric/Behavioral: Negative.     Allergies as of 05/15/2019      Reactions   Atorvastatin Other (See Comments)   Muscle aches   Carbamazepine Nausea And Vomiting   Chest tightness Chest tightness   Pregabalin Nausea And Vomiting   Latex Dermatitis      Medication List       Accurate as of May 15, 2019 11:59 PM. If you have any questions, ask your nurse or doctor.        STOP taking these medications   vitamin A 10000 UNIT capsule Stopped by: Terald Sleeper, PA-C     TAKE these medications   ALPRAZolam 1 MG tablet Commonly known as: XANAX Take 1 tablet (1 mg total) by mouth at bedtime. What changed:   how much to take  when to take this  Another medication with the same name was removed. Continue taking this medication, and follow the directions you see here. Changed by: Terald Sleeper, PA-C   cycloSPORINE 0.05 % ophthalmic emulsion Commonly known as: RESTASIS 1 drop 2 (two) times daily.   fluorometholone 0.1 % ophthalmic suspension Commonly known as: FML   fluticasone 50 MCG/ACT nasal spray Commonly known as: FLONASE SPRAY 1 SPRAY IN EACH NOSTRIL  ONCE DAILY.   GoodSense Aspirin 325 MG tablet Generic drug: aspirin Take 325 mg by mouth.   levocetirizine 5 MG tablet Commonly known as: XYZAL Take 5 mg by mouth every evening.   loratadine 10 MG tablet Commonly known as: CLARITIN Take by mouth.   multivitamin capsule Take by mouth.   naproxen sodium 220 MG tablet Commonly known as: ALEVE Take by mouth.   olopatadine 0.1 % ophthalmic solution Commonly known as: PATANOL 1 drop 2 times daily.   Potassium 99 MG Tabs Take by mouth.   vitamin B-12 1000 MCG tablet Commonly known as: CYANOCOBALAMIN Take 1,000 mcg by mouth daily.   Vitamin D 50 MCG (2000 UT) Caps Take by  mouth.   vitamin E 400 UNIT capsule Take 400 Units by mouth daily.          Objective:    BP 137/86   Pulse 67   Temp (!) 97.1 F (36.2 C) (Temporal)   Ht '6\' 1"'$  (1.854 m)   Wt 192 lb 3.2 oz (87.2 kg)   SpO2 96%   BMI 25.36 kg/m   Allergies  Allergen Reactions  . Atorvastatin Other (See Comments)    Muscle aches  . Carbamazepine Nausea And Vomiting    Chest tightness Chest tightness   . Pregabalin Nausea And Vomiting  . Latex Dermatitis    Wt Readings from Last 3 Encounters:  05/15/19 192 lb 3.2 oz (87.2 kg)  04/25/17 177 lb (80.3 kg)  02/11/17 180 lb 9.6 oz (81.9 kg)    Physical Exam Vitals signs and nursing note reviewed.  Constitutional:      General: He is not in acute distress.    Appearance: He is well-developed.  HENT:     Head: Normocephalic and atraumatic.  Eyes:     Conjunctiva/sclera: Conjunctivae normal.     Pupils: Pupils are equal, round, and reactive to light.  Cardiovascular:     Rate and Rhythm: Normal rate and regular rhythm.     Heart sounds: Normal heart sounds.  Pulmonary:     Effort: Pulmonary effort is normal. No respiratory distress.     Breath sounds: Normal breath sounds.  Skin:    General: Skin is warm and dry.  Neurological:     Comments: Left with gaze and inability to completely close eyelid, this was previous damage from the first tumor  Psychiatric:        Behavior: Behavior normal.     Results for orders placed or performed during the hospital encounter of 12/31/18  Creatinine, serum  Result Value Ref Range   Creatinine, Ser 0.92 0.61 - 1.24 mg/dL   GFR calc non Af Amer >60 >60 mL/min   GFR calc Af Amer >60 >60 mL/min      Assessment & Plan:   1. Well adult exam - CBC with Differential/Platelet; Future - CMP14+EGFR; Future - Lipid panel; Future - PSA; Future  2. Meningioma of right sphenoid wing involving cavernous sinus (HCC) Continue with the neurosurgeon  3. Meningioma [facility]) Continue with the  neurosurgeon  4. Acute rhinitis Use antihistamine as needed  5. Sensorineural hearing loss (SNHL), bilateral Continue with eyedrops  6. Primary insomnia Alprazolam 1 mg 1 at bedtime - ALPRAZolam (XANAX) 1 MG tablet; Take 1 tablet (1 mg total) by mouth at bedtime.  Dispense: 30 tablet; Refill: 5   Continue all other maintenance medications as listed above.  Follow up plan: Return in about 6 months (around 11/12/2019) for follow up anxiety meds.  Educational handout given for insomnia  Terald Sleeper PA-C [facility] [address]  [place], [place] [address] [phone]   05/18/2019, 11:02 PM

## 2019-05-22 ENCOUNTER — Other Ambulatory Visit: Payer: BC Managed Care – PPO

## 2019-05-22 ENCOUNTER — Other Ambulatory Visit: Payer: Self-pay

## 2019-05-22 DIAGNOSIS — Z0000 Encounter for general adult medical examination without abnormal findings: Secondary | ICD-10-CM | POA: Diagnosis not present

## 2019-05-22 NOTE — Addendum Note (Signed)
Addended by: Earlene Plater on: 05/22/2019 01:01 PM   Modules accepted: Orders

## 2019-05-23 LAB — CBC WITH DIFFERENTIAL/PLATELET
Basophils Absolute: 0 10*3/uL (ref 0.0–0.2)
Basos: 0 %
EOS (ABSOLUTE): 0.2 10*3/uL (ref 0.0–0.4)
Eos: 4 %
Hematocrit: 48.2 % (ref 37.5–51.0)
Hemoglobin: 16.2 g/dL (ref 13.0–17.7)
Immature Grans (Abs): 0 10*3/uL (ref 0.0–0.1)
Immature Granulocytes: 0 %
Lymphocytes Absolute: 0.9 10*3/uL (ref 0.7–3.1)
Lymphs: 16 %
MCH: 29 pg (ref 26.6–33.0)
MCHC: 33.6 g/dL (ref 31.5–35.7)
MCV: 86 fL (ref 79–97)
Monocytes Absolute: 0.6 10*3/uL (ref 0.1–0.9)
Monocytes: 10 %
Neutrophils Absolute: 4.1 10*3/uL (ref 1.4–7.0)
Neutrophils: 70 %
Platelets: 202 10*3/uL (ref 150–450)
RBC: 5.58 x10E6/uL (ref 4.14–5.80)
RDW: 13.5 % (ref 11.6–15.4)
WBC: 5.8 10*3/uL (ref 3.4–10.8)

## 2019-05-23 LAB — CMP14+EGFR
ALT: 13 IU/L (ref 0–44)
AST: 16 IU/L (ref 0–40)
Albumin/Globulin Ratio: 2.1 (ref 1.2–2.2)
Albumin: 4.6 g/dL (ref 3.8–4.8)
Alkaline Phosphatase: 73 IU/L (ref 39–117)
BUN/Creatinine Ratio: 19 (ref 10–24)
BUN: 19 mg/dL (ref 8–27)
Bilirubin Total: 0.6 mg/dL (ref 0.0–1.2)
CO2: 25 mmol/L (ref 20–29)
Calcium: 9.5 mg/dL (ref 8.6–10.2)
Chloride: 102 mmol/L (ref 96–106)
Creatinine, Ser: 0.98 mg/dL (ref 0.76–1.27)
GFR calc Af Amer: 94 mL/min/{1.73_m2} (ref >59)
GFR calc non Af Amer: 81 mL/min/{1.73_m2} (ref >59)
Globulin, Total: 2.2 g/dL (ref 1.5–4.5)
Glucose: 93 mg/dL (ref 65–99)
Potassium: 4.4 mmol/L (ref 3.5–5.2)
Sodium: 142 mmol/L (ref 134–144)
Total Protein: 6.8 g/dL (ref 6.0–8.5)

## 2019-05-23 LAB — LIPID PANEL
Chol/HDL Ratio: 8 ratio — ABNORMAL HIGH (ref 0.0–5.0)
Cholesterol, Total: 320 mg/dL — ABNORMAL HIGH (ref 100–199)
HDL: 40 mg/dL (ref >39)
LDL Chol Calc (NIH): 222 mg/dL — ABNORMAL HIGH (ref 0–99)
Triglycerides: 284 mg/dL — ABNORMAL HIGH (ref 0–149)
VLDL Cholesterol Cal: 58 mg/dL — ABNORMAL HIGH (ref 5–40)

## 2019-05-23 LAB — PSA: Prostate Specific Ag, Serum: 0.8 ng/mL (ref 0.0–4.0)

## 2019-05-25 ENCOUNTER — Other Ambulatory Visit: Payer: Self-pay | Admitting: Physician Assistant

## 2019-05-25 MED ORDER — PRAVASTATIN SODIUM 40 MG PO TABS: 20.0000 mg | ORAL_TABLET | Freq: Every day | ORAL | 5 refills | Status: DC

## 2019-05-27 DIAGNOSIS — H16211 Exposure keratoconjunctivitis, right eye: Secondary | ICD-10-CM | POA: Diagnosis not present

## 2019-05-27 DIAGNOSIS — H04121 Dry eye syndrome of right lacrimal gland: Secondary | ICD-10-CM | POA: Diagnosis not present

## 2019-07-20 DIAGNOSIS — Z961 Presence of intraocular lens: Secondary | ICD-10-CM | POA: Diagnosis not present

## 2019-07-20 DIAGNOSIS — H04121 Dry eye syndrome of right lacrimal gland: Secondary | ICD-10-CM | POA: Diagnosis not present

## 2019-07-20 DIAGNOSIS — H16211 Exposure keratoconjunctivitis, right eye: Secondary | ICD-10-CM | POA: Diagnosis not present

## 2019-07-31 ENCOUNTER — Other Ambulatory Visit: Payer: Self-pay

## 2019-07-31 ENCOUNTER — Other Ambulatory Visit: Payer: BC Managed Care – PPO

## 2019-07-31 ENCOUNTER — Other Ambulatory Visit: Payer: Self-pay | Admitting: *Deleted

## 2019-07-31 DIAGNOSIS — E785 Hyperlipidemia, unspecified: Secondary | ICD-10-CM

## 2019-08-01 LAB — LIPID PANEL
Chol/HDL Ratio: 7 ratio — ABNORMAL HIGH (ref 0.0–5.0)
Cholesterol, Total: 280 mg/dL — ABNORMAL HIGH (ref 100–199)
HDL: 40 mg/dL (ref >39)
LDL Chol Calc (NIH): 199 mg/dL — ABNORMAL HIGH (ref 0–99)
Triglycerides: 210 mg/dL — ABNORMAL HIGH (ref 0–149)
VLDL Cholesterol Cal: 41 mg/dL — ABNORMAL HIGH (ref 5–40)

## 2019-08-04 ENCOUNTER — Encounter: Payer: Self-pay | Admitting: Physician Assistant

## 2019-08-04 ENCOUNTER — Ambulatory Visit (INDEPENDENT_AMBULATORY_CARE_PROVIDER_SITE_OTHER): Payer: BC Managed Care – PPO | Admitting: Physician Assistant

## 2019-08-04 DIAGNOSIS — J0101 Acute recurrent maxillary sinusitis: Secondary | ICD-10-CM

## 2019-08-04 MED ORDER — AZITHROMYCIN 250 MG PO TABS: ORAL_TABLET | ORAL | 0 refills | Status: DC

## 2019-08-04 NOTE — Progress Notes (Signed)
Telephone visit  Subjective: [id number] infection recurrent PCP: Terald Sleeper, PA-C [id number] R Rottman is a 63 y.o. male calls for telephone consult today. Patient provides verbal consent for consult held via phone.  Patient is identified with 2 separate identifiers.  At this time the entire area is on COVID-19 social distancing and stay home orders are in place.  Patient is of higher risk and therefore we are performing this by a virtual method.  Location of patient: home Location of provider: [facility] Others present for call: no  He does have a history of recurrent sinus infections on the right side.  This is the time where he had his meningioma surgery performed and it did go into his sinus cavity.  So ever since he had the surgery he does frequently get infections.  He never had problems with it before he had his tumor.    This patient has had many days of sinus headache and postnasal drainage. There is copious drainage at times. Denies any fever at this time. There has been a history of sinus infections in the past.  No history of sinus surgery. There is cough at night. It has become more prevalent in recent days.    ROS: Per HPI  Allergies  Allergen Reactions  . Atorvastatin Other (See Comments)    Muscle aches  . Carbamazepine Nausea And Vomiting    Chest tightness Chest tightness   . Pregabalin Nausea And Vomiting  . Latex Dermatitis   Past Medical History:  Diagnosis Date  . Anxiety    Has headaches associated with past surgery.  . Cancer [facility])    Brain     Current Outpatient Medications:  .  ALPRAZolam (XANAX) 1 MG tablet, Take 1 tablet (1 mg total) by mouth at bedtime., Disp: 30 tablet, Rfl: 5 .  aspirin (GOODSENSE ASPIRIN) 325 MG tablet, Take 325 mg by mouth., Disp: , Rfl:  .  azithromycin (ZITHROMAX Z-PAK) 250 MG tablet, Take as directed, Disp: 6 each, Rfl: 0 .  Cholecalciferol (VITAMIN D) 2000 units CAPS, Take by mouth., Disp: , Rfl:  .  cycloSPORINE  (RESTASIS) 0.05 % ophthalmic emulsion, 1 drop 2 (two) times daily., Disp: , Rfl:  .  fluorometholone (FML) 0.1 % ophthalmic suspension, , Disp: , Rfl:  .  fluticasone (FLONASE) 50 MCG/ACT nasal spray, SPRAY 1 SPRAY IN EACH NOSTRIL ONCE DAILY., Disp: 16 g, Rfl: 2 .  levocetirizine (XYZAL) 5 MG tablet, Take 5 mg by mouth every evening., Disp: , Rfl:  .  loratadine (CLARITIN) 10 MG tablet, Take by mouth., Disp: , Rfl:  .  Multiple Vitamin (MULTIVITAMIN) capsule, Take by mouth., Disp: , Rfl:  .  naproxen sodium (ALEVE) 220 MG tablet, Take by mouth., Disp: , Rfl:  .  olopatadine (PATANOL) 0.1 % ophthalmic solution, 1 drop 2 times daily., Disp: , Rfl:  .  Potassium 99 MG TABS, Take by mouth., Disp: , Rfl:  .  pravastatin (PRAVACHOL) 40 MG tablet, Take 0.5 tablets (20 mg total) by mouth daily. Take Coenzyme Q10 supplement with med to prevent aches, Disp: 30 tablet, Rfl: 5 .  vitamin B-12 (CYANOCOBALAMIN) 1000 MCG tablet, Take 1,000 mcg by mouth daily., Disp: , Rfl:  .  vitamin E 400 UNIT capsule, Take 400 Units by mouth daily., Disp: , Rfl:   Assessment/ Plan: 63 y.o. male   1. Acute recurrent maxillary sinusitis - azithromycin (ZITHROMAX Z-PAK) 250 MG tablet; Take as directed  Dispense: 6 each; Refill: 0  No follow-ups on file.  Continue all other maintenance medications as listed above.  Start time: 1:35 PM End time: 1:41 PM  Meds ordered this encounter  Medications  . azithromycin (ZITHROMAX Z-PAK) 250 MG tablet    Sig: Take as directed    Dispense:  6 each    Refill:  0    Order Specific Question:   Supervising Provider    Answer:   Janora Norlander [id number]    Particia Nearing PA-C [place] [phone]

## 2019-10-23 DIAGNOSIS — H04129 Dry eye syndrome of unspecified lacrimal gland: Secondary | ICD-10-CM | POA: Diagnosis not present

## 2019-10-23 DIAGNOSIS — H16231 Neurotrophic keratoconjunctivitis, right eye: Secondary | ICD-10-CM | POA: Diagnosis not present

## 2019-10-23 DIAGNOSIS — Z961 Presence of intraocular lens: Secondary | ICD-10-CM | POA: Diagnosis not present

## 2019-10-23 DIAGNOSIS — H168 Other keratitis: Secondary | ICD-10-CM | POA: Diagnosis not present

## 2019-11-01 DIAGNOSIS — Z23 Encounter for immunization: Secondary | ICD-10-CM | POA: Diagnosis not present

## 2019-11-13 ENCOUNTER — Ambulatory Visit: Payer: BC Managed Care – PPO | Admitting: Physician Assistant

## 2019-11-18 ENCOUNTER — Encounter: Payer: Self-pay | Admitting: Family Medicine

## 2019-11-18 ENCOUNTER — Ambulatory Visit: Payer: BC Managed Care – PPO | Admitting: Family Medicine

## 2019-11-18 ENCOUNTER — Other Ambulatory Visit: Payer: Self-pay

## 2019-11-18 VITALS — BP 119/69 | HR 68 | Temp 98.6°F | Resp 20 | Ht 73.0 in | Wt 190.0 lb

## 2019-11-18 DIAGNOSIS — Z79899 Other long term (current) drug therapy: Secondary | ICD-10-CM | POA: Insufficient documentation

## 2019-11-18 DIAGNOSIS — J0101 Acute recurrent maxillary sinusitis: Secondary | ICD-10-CM

## 2019-11-18 DIAGNOSIS — F5101 Primary insomnia: Secondary | ICD-10-CM | POA: Diagnosis not present

## 2019-11-18 MED ORDER — ALPRAZOLAM 1 MG PO TABS: 1.0000 mg | ORAL_TABLET | Freq: Every day | ORAL | 3 refills | Status: DC

## 2019-11-18 MED ORDER — PREDNISONE 20 MG PO TABS: ORAL_TABLET | ORAL | 0 refills | Status: DC

## 2019-11-18 MED ORDER — DOXYCYCLINE HYCLATE 100 MG PO TABS: 100.0000 mg | ORAL_TABLET | Freq: Two times a day (BID) | ORAL | 0 refills | Status: AC

## 2019-11-18 NOTE — Patient Instructions (Signed)

## 2019-11-18 NOTE — Progress Notes (Signed)
Subjective:  Patient ID: Frank Hayden, male    DOB: 1955/09/05, 64 y.o.   MRN: [id number]  Patient Care Team: Sharion Balloon, FNP as PCP - General (Family Medicine)   Chief Complaint:  Medical Management of Chronic Issues (6 mo ) and Hyperlipidemia   HPI: Frank Hayden is a 64 y.o. male presenting on 11/18/2019 for Medical Management of Chronic Issues (6 mo ) and Hyperlipidemia   Pt presents today for a refill on his Xanax. Pt states he takes this for insomnia. States he has tried and failed several other treatment modalities post his brain tumor removal and this has been the only thing that has helped with his symptoms. States he does well with this and tries to only take this when needed.  He also reports maxillary sinus pressure and pain. States this started about 3 weeks ago and is not improving despite home therapies with Mucinex, Flonase, Saline nasal sprays, and antihistamines. States the pain is pressure like in nature and 6/10 at worst.   Insomnia Primary symptoms: fragmented sleep, sleep disturbance, difficulty falling asleep, frequent awakening, premature morning awakening.  The current episode started more than one year. The problem occurs nightly. The problem has been waxing and waning since onset. The symptoms are relieved by medication. Past treatments include meditation. The treatment provided significant relief.  Sinusitis This is a recurrent problem. The current episode started 1 to 4 weeks ago. The problem has been gradually worsening since onset. There has been no fever. His pain is at a severity of 6/10. The pain is moderate. Associated symptoms include chills, congestion, coughing, headaches and sinus pressure. Pertinent negatives include no diaphoresis, ear pain, hoarse voice, neck pain, shortness of breath, sneezing, sore throat or swollen glands. Past treatments include saline sprays, oral decongestants and spray decongestants. The treatment provided no relief.       Relevant past medical, surgical, family, and social history reviewed and updated as indicated.  Allergies and medications reviewed and updated. Date reviewed: Chart in Epic.   Past Medical History:  Diagnosis Date  . Anxiety    Has headaches associated with past surgery.  . Cancer ([place])    Brain   . Hearing loss    decreased 95% right / decreased 24% left ear    Past Surgical History:  Procedure Laterality Date  . APPENDECTOMY    . EYE SURGERY Right    Tumor removed behind eye.  . TONSILLECTOMY      Social History   Socioeconomic History  . Marital status: Married    Spouse name: Not on file  . Number of children: Not on file  . Years of education: Not on file  . Highest education level: Not on file  Occupational History  . Not on file  Tobacco Use  . Smoking status: Former Research scientist (life sciences)  . Smokeless tobacco: Never Used  . Tobacco comment: quit 40 years ago  Substance and Sexual Activity  . Alcohol use: No  . Drug use: No  . Sexual activity: Not on file  Other Topics Concern  . Not on file  Social History Narrative  . Not on file   Social Determinants of Health   Financial Resource Strain:   . Difficulty of Paying Living Expenses:   Food Insecurity:   . Worried About Charity fundraiser in the Last Year:   . Arboriculturist in the Last Year:   Transportation Needs:   . Lack of Transportation (  Medical):   Marland Kitchen Lack of Transportation (Non-Medical):   Physical Activity:   . Days of Exercise per Week:   . Minutes of Exercise per Session:   Stress:   . Feeling of Stress :   Social Connections:   . Frequency of Communication with Friends and Family:   . Frequency of Social Gatherings with Friends and Family:   . Attends Religious Services:   . Active Member of Clubs or Organizations:   . Attends Archivist Meetings:   Marland Kitchen Marital Status:   Intimate Partner Violence:   . Fear of Current or Ex-Partner:   . Emotionally Abused:   Marland Kitchen Physically  Abused:   . Sexually Abused:     Outpatient Encounter Medications as of 11/18/2019  Medication Sig  . ALPRAZolam (XANAX) 1 MG tablet Take 1 tablet (1 mg total) by mouth at bedtime.  Marland Kitchen aspirin (GOODSENSE ASPIRIN) 325 MG tablet Take 325 mg by mouth.  . Cholecalciferol (VITAMIN D) 2000 units CAPS Take by mouth.  . cycloSPORINE (RESTASIS) 0.05 % ophthalmic emulsion 1 drop 2 (two) times daily.  . fluorometholone (FML) 0.1 % ophthalmic suspension   . fluticasone (FLONASE) 50 MCG/ACT nasal spray SPRAY 1 SPRAY IN EACH NOSTRIL ONCE DAILY.  Marland Kitchen levocetirizine (XYZAL) 5 MG tablet Take 5 mg by mouth every evening.  . Multiple Vitamin (MULTIVITAMIN) capsule Take by mouth.  . naproxen sodium (ALEVE) 220 MG tablet Take by mouth.  Marland Kitchen olopatadine (PATANOL) 0.1 % ophthalmic solution 1 drop 2 times daily.  . Potassium 99 MG TABS Take by mouth.  . vitamin B-12 (CYANOCOBALAMIN) 1000 MCG tablet Take 1,000 mcg by mouth daily.  . vitamin E 400 UNIT capsule Take 400 Units by mouth daily.  . Zinc Sulfate (ZINC 15 PO) Take by mouth.  . [DISCONTINUED] ALPRAZolam (XANAX) 1 MG tablet Take 1 tablet (1 mg total) by mouth at bedtime.  Marland Kitchen doxycycline (VIBRA-TABS) 100 MG tablet Take 1 tablet (100 mg total) by mouth 2 (two) times daily for 10 days. 1 po bid  . loratadine (CLARITIN) 10 MG tablet Take by mouth.  . pravastatin (PRAVACHOL) 40 MG tablet Take 0.5 tablets (20 mg total) by mouth daily. Take Coenzyme Q10 supplement with med to prevent aches (Patient not taking: Reported on 11/18/2019)  . predniSONE (DELTASONE) 20 MG tablet 2 po at sametime daily for 5 days  . [DISCONTINUED] azithromycin (ZITHROMAX Z-PAK) 250 MG tablet Take as directed   No facility-administered encounter medications on file as of 11/18/2019.    Allergies  Allergen Reactions  . Atorvastatin Other (See Comments)    Muscle aches  . Carbamazepine Nausea And Vomiting    Chest tightness Chest tightness   . Pregabalin Nausea And Vomiting  . Latex  Dermatitis    Review of Systems  Constitutional: Positive for chills. Negative for activity change, appetite change, diaphoresis, fatigue, fever and unexpected weight change.  HENT: Positive for congestion, hearing loss, postnasal drip, sinus pressure and sinus pain. Negative for ear pain, hoarse voice, rhinorrhea, sneezing, sore throat, tinnitus, trouble swallowing and voice change.   Eyes: Negative.   Respiratory: Positive for cough. Negative for chest tightness and shortness of breath.   Cardiovascular: Negative for chest pain, palpitations and leg swelling.  Gastrointestinal: Negative for abdominal pain, blood in stool, constipation, diarrhea, nausea and vomiting.  Endocrine: Negative.  Negative for polydipsia, polyphagia and polyuria.  Genitourinary: Negative for decreased urine volume, difficulty urinating, dysuria, frequency and urgency.  Musculoskeletal: Negative for arthralgias, myalgias and neck pain.  Skin: Negative.   Allergic/Immunologic: Negative.   Neurological: Positive for headaches. Negative for dizziness, tremors, seizures, syncope, facial asymmetry, speech difficulty, weakness, light-headedness and numbness.  Hematological: Negative.   Psychiatric/Behavioral: Positive for sleep disturbance. Negative for confusion, hallucinations and suicidal ideas. The patient has insomnia.   All other systems reviewed and are negative.       Objective:  BP 119/69 (BP Location: Right Arm, Cuff Size: Large)   Pulse 68   Temp 98.6 F (37 C)   Resp 20   Ht 6\' 1"  (1.854 m)   Wt 190 lb (86.2 kg)   SpO2 97%   BMI 25.07 kg/m    Wt Readings from Last 3 Encounters:  11/18/19 190 lb (86.2 kg)  05/15/19 192 lb 3.2 oz (87.2 kg)  04/25/17 177 lb (80.3 kg)    Physical Exam Vitals and nursing note reviewed.  Constitutional:      General: He is not in acute distress.    Appearance: Normal appearance. He is well-developed and well-groomed. He is not ill-appearing, toxic-appearing or  diaphoretic.  HENT:     Head: Normocephalic and atraumatic.     Jaw: There is normal jaw occlusion.     Right Ear: Tympanic membrane, ear canal and external ear normal. Decreased hearing noted.     Left Ear: Tympanic membrane, ear canal and external ear normal. Decreased hearing noted.     Ears:     Comments: Bilateral hearing aids    Nose: Congestion and rhinorrhea present.     Right Turbinates: Swollen.     Left Turbinates: Swollen.     Right Sinus: Maxillary sinus tenderness present. No frontal sinus tenderness.     Left Sinus: No maxillary sinus tenderness or frontal sinus tenderness.     Mouth/Throat:     Lips: Pink.     Mouth: Mucous membranes are moist.     Pharynx: Oropharynx is clear. Uvula midline. Posterior oropharyngeal erythema present. No pharyngeal swelling, oropharyngeal exudate or uvula swelling.     Tonsils: No tonsillar exudate or tonsillar abscesses.  Eyes:     General: Lids are normal.     Extraocular Movements: Extraocular movements intact.     Conjunctiva/sclera: Conjunctivae normal.     Pupils: Pupils are equal, round, and reactive to light.     Comments: Right lid ptosis  Neck:     Thyroid: No thyroid mass, thyromegaly or thyroid tenderness.     Vascular: No carotid bruit or JVD.     Trachea: Trachea and phonation normal.  Cardiovascular:     Rate and Rhythm: Normal rate and regular rhythm.     Chest Wall: PMI is not displaced.     Pulses: Normal pulses.     Heart sounds: Normal heart sounds. No murmur. No friction rub. No gallop.   Pulmonary:     Effort: Pulmonary effort is normal. No respiratory distress.     Breath sounds: Normal breath sounds. No wheezing.  Abdominal:     General: Bowel sounds are normal. There is no distension or abdominal bruit.     Palpations: Abdomen is soft. There is no hepatomegaly or splenomegaly.     Tenderness: There is no abdominal tenderness. There is no right CVA tenderness or left CVA tenderness.     Hernia: No hernia  is present.  Musculoskeletal:        General: Normal range of motion.     Cervical back: Normal range of motion and neck supple.     Right  lower leg: No edema.     Left lower leg: No edema.  Lymphadenopathy:     Cervical: No cervical adenopathy.  Skin:    General: Skin is warm and dry.     Capillary Refill: Capillary refill takes less than 2 seconds.     Coloration: Skin is not cyanotic, jaundiced or pale.     Findings: No rash.  Neurological:     General: No focal deficit present.     Mental Status: He is alert and oriented to person, place, and time.     Cranial Nerves: Cranial nerves are intact.     Sensory: Sensation is intact.     Motor: Motor function is intact.     Coordination: Coordination is intact.     Gait: Gait is intact.     Deep Tendon Reflexes: Reflexes are normal and symmetric.  Psychiatric:        Attention and Perception: Attention and perception normal.        Mood and Affect: Mood and affect normal.        Speech: Speech normal.        Behavior: Behavior normal. Behavior is cooperative.        Thought Content: Thought content normal.        Cognition and Memory: Cognition and memory normal.        Judgment: Judgment normal.     Results for orders placed or performed in visit on 07/31/19  Lipid panel  Result Value Ref Range   Cholesterol, Total 280 (H) 100 - 199 mg/dL   Triglycerides 210 (H) 0 - 149 mg/dL   HDL 40 >39 mg/dL   VLDL Cholesterol Cal 41 (H) 5 - 40 mg/dL   LDL Chol Calc (NIH) 199 (H) 0 - 99 mg/dL   Lipid Comment: Comment    Chol/HDL Ratio 7.0 (H) 0.0 - 5.0 ratio       Pertinent labs & imaging results that were available during my care of the patient were reviewed by me and considered in my medical decision making.  Assessment & Plan:  Frank Hayden was seen today for medical management of chronic issues and hyperlipidemia.  Diagnoses and all orders for this visit:  Primary insomnia Controlled substance agreement signed Pt has been  taking below for several years without adverse side effects. Has tried other medications without relief of symptoms. Will continue. Pt aware to only use as needed and that he needs to try to taper down on the dosing.  -     ALPRAZolam (XANAX) 1 MG tablet; Take 1 tablet (1 mg total) by mouth at bedtime. -     ToxASSURE Select 13 (MW), Urine  Acute recurrent maxillary sinusitis Symptomatic care discussed in detail. Medications as prescribed. Pt aware to report any new, worsening, or persistent symptoms. Follow up as needed.  -     doxycycline (VIBRA-TABS) 100 MG tablet; Take 1 tablet (100 mg total) by mouth 2 (two) times daily for 10 days. 1 po bid -     predniSONE (DELTASONE) 20 MG tablet; 2 po at sametime daily for 5 days     Continue all other maintenance medications.  Follow up plan: Return if symptoms worsen or fail to improve.   Continue healthy lifestyle choices, including diet (rich in fruits, vegetables, and lean proteins, and low in salt and simple carbohydrates) and exercise (at least 30 minutes of moderate physical activity daily).  Educational handout given for sinusitis  The above assessment and management plan  was discussed with the patient. The patient verbalized understanding of and has agreed to the management plan. Patient is aware to call the clinic if they develop any new symptoms or if symptoms persist or worsen. Patient is aware when to return to the clinic for a follow-up visit. Patient educated on when it is appropriate to go to the emergency department.   Monia Pouch, FNP-C [facility] [phone]

## 2019-11-20 DIAGNOSIS — H16231 Neurotrophic keratoconjunctivitis, right eye: Secondary | ICD-10-CM | POA: Diagnosis not present

## 2019-11-20 DIAGNOSIS — H168 Other keratitis: Secondary | ICD-10-CM | POA: Diagnosis not present

## 2019-11-20 DIAGNOSIS — H538 Other visual disturbances: Secondary | ICD-10-CM | POA: Diagnosis not present

## 2019-11-20 DIAGNOSIS — Z961 Presence of intraocular lens: Secondary | ICD-10-CM | POA: Diagnosis not present

## 2019-11-22 DIAGNOSIS — Z23 Encounter for immunization: Secondary | ICD-10-CM | POA: Diagnosis not present

## 2019-11-22 LAB — TOXASSURE SELECT 13 (MW), URINE

## 2019-12-16 DIAGNOSIS — H16211 Exposure keratoconjunctivitis, right eye: Secondary | ICD-10-CM | POA: Diagnosis not present

## 2019-12-16 DIAGNOSIS — Z961 Presence of intraocular lens: Secondary | ICD-10-CM | POA: Diagnosis not present

## 2019-12-16 DIAGNOSIS — H04121 Dry eye syndrome of right lacrimal gland: Secondary | ICD-10-CM | POA: Diagnosis not present

## 2019-12-16 DIAGNOSIS — H02411 Mechanical ptosis of right eyelid: Secondary | ICD-10-CM | POA: Diagnosis not present

## 2020-01-01 DIAGNOSIS — L249 Irritant contact dermatitis, unspecified cause: Secondary | ICD-10-CM | POA: Diagnosis not present

## 2020-01-01 DIAGNOSIS — C4441 Basal cell carcinoma of skin of scalp and neck: Secondary | ICD-10-CM | POA: Diagnosis not present

## 2020-01-01 DIAGNOSIS — L821 Other seborrheic keratosis: Secondary | ICD-10-CM | POA: Diagnosis not present

## 2020-01-01 DIAGNOSIS — L905 Scar conditions and fibrosis of skin: Secondary | ICD-10-CM | POA: Diagnosis not present

## 2020-01-01 DIAGNOSIS — D485 Neoplasm of uncertain behavior of skin: Secondary | ICD-10-CM | POA: Diagnosis not present

## 2020-01-01 DIAGNOSIS — D225 Melanocytic nevi of trunk: Secondary | ICD-10-CM | POA: Diagnosis not present

## 2020-01-01 DIAGNOSIS — L570 Actinic keratosis: Secondary | ICD-10-CM | POA: Diagnosis not present

## 2020-02-19 ENCOUNTER — Ambulatory Visit: Payer: BC Managed Care – PPO | Admitting: Family

## 2020-02-19 ENCOUNTER — Other Ambulatory Visit: Payer: Self-pay

## 2020-02-19 ENCOUNTER — Encounter: Payer: Self-pay | Admitting: Family

## 2020-02-19 DIAGNOSIS — Z79899 Other long term (current) drug therapy: Secondary | ICD-10-CM

## 2020-02-19 DIAGNOSIS — F1320 Sedative, hypnotic or anxiolytic dependence, uncomplicated: Secondary | ICD-10-CM | POA: Insufficient documentation

## 2020-02-19 DIAGNOSIS — Z1211 Encounter for screening for malignant neoplasm of colon: Secondary | ICD-10-CM

## 2020-02-19 DIAGNOSIS — J329 Chronic sinusitis, unspecified: Secondary | ICD-10-CM

## 2020-02-19 DIAGNOSIS — F5101 Primary insomnia: Secondary | ICD-10-CM | POA: Diagnosis not present

## 2020-02-19 DIAGNOSIS — D329 Benign neoplasm of meninges, unspecified: Secondary | ICD-10-CM | POA: Diagnosis not present

## 2020-02-19 MED ORDER — AMOXICILLIN-POT CLAVULANATE 875-125 MG PO TABS: 1.0000 | ORAL_TABLET | Freq: Two times a day (BID) | ORAL | 0 refills | Status: DC

## 2020-02-19 MED ORDER — ALPRAZOLAM 1 MG PO TABS: 1.0000 mg | ORAL_TABLET | Freq: Every day | ORAL | 3 refills | Status: DC

## 2020-02-19 NOTE — Patient Instructions (Signed)
Sinusitis, Adult Sinusitis is inflammation of your sinuses. Sinuses are hollow spaces in the bones around your face. Your sinuses are located:  Around your eyes.  In the middle of your forehead.  Behind your nose.  In your cheekbones. Mucus normally drains out of your sinuses. When your nasal tissues become inflamed or swollen, mucus can become trapped or blocked. This allows bacteria, viruses, and fungi to grow, which leads to infection. Most infections of the sinuses are caused by a virus. Sinusitis can develop quickly. It can last for up to 4 weeks (acute) or for more than 12 weeks (chronic). Sinusitis often develops after a cold. What are the causes? This condition is caused by anything that creates swelling in the sinuses or stops mucus from draining. This includes:  Allergies.  Asthma.  Infection from bacteria or viruses.  Deformities or blockages in your nose or sinuses.  Abnormal growths in the nose (nasal polyps).  Pollutants, such as chemicals or irritants in the air.  Infection from fungi (rare). What increases the risk? You are more likely to develop this condition if you:  Have a weak body defense system (immune system).  Do a lot of swimming or diving.  Overuse nasal sprays.  Smoke. What are the signs or symptoms? The main symptoms of this condition are pain and a feeling of pressure around the affected sinuses. Other symptoms include:  Stuffy nose or congestion.  Thick drainage from your nose.  Swelling and warmth over the affected sinuses.  Headache.  Upper toothache.  A cough that may get worse at night.  Extra mucus that collects in the throat or the back of the nose (postnasal drip).  Decreased sense of smell and taste.  Fatigue.  A fever.  Sore throat.  Bad breath. How is this diagnosed? This condition is diagnosed based on:  Your symptoms.  Your medical history.  A physical exam.  Tests to find out if your condition is  acute or chronic. This may include: ? Checking your nose for nasal polyps. ? Viewing your sinuses using a device that has a light (endoscope). ? Testing for allergies or bacteria. ? Imaging tests, such as an MRI or CT scan. In rare cases, a bone biopsy may be done to rule out more serious types of fungal sinus disease. How is this treated? Treatment for sinusitis depends on the cause and whether your condition is chronic or acute.  If caused by a virus, your symptoms should go away on their own within 10 days. You may be given medicines to relieve symptoms. They include: ? Medicines that shrink swollen nasal passages (topical intranasal decongestants). ? Medicines that treat allergies (antihistamines). ? A spray that eases inflammation of the nostrils (topical intranasal corticosteroids). ? Rinses that help get rid of thick mucus in your nose (nasal saline washes).  If caused by bacteria, your health care provider may recommend waiting to see if your symptoms improve. Most bacterial infections will get better without antibiotic medicine. You may be given antibiotics if you have: ? A severe infection. ? A weak immune system.  If caused by narrow nasal passages or nasal polyps, you may need to have surgery. Follow these instructions at home: Medicines  Take, use, or apply over-the-counter and prescription medicines only as told by your health care provider. These may include nasal sprays.  If you were prescribed an antibiotic medicine, take it as told by your health care provider. Do not stop taking the antibiotic even if you start   to feel better. Hydrate and humidify   Drink enough fluid to keep your urine pale yellow. Staying hydrated will help to thin your mucus.  Use a cool mist humidifier to keep the humidity level in your home above 50%.  Inhale steam for 10-15 minutes, 3-4 times a day, or as told by your health care provider. You can do this in the bathroom while a hot shower is  running.  Limit your exposure to cool or dry air. Rest  Rest as much as possible.  Sleep with your head raised (elevated).  Make sure you get enough sleep each night. General instructions   Apply a warm, moist washcloth to your face 3-4 times a day or as told by your health care provider. This will help with discomfort.  Wash your hands often with soap and water to reduce your exposure to germs. If soap and water are not available, use hand sanitizer.  Do not smoke. Avoid being around people who are smoking (secondhand smoke).  Keep all follow-up visits as told by your health care provider. This is important. Contact a health care provider if:  You have a fever.  Your symptoms get worse.  Your symptoms do not improve within 10 days. Get help right away if:  You have a severe headache.  You have persistent vomiting.  You have severe pain or swelling around your face or eyes.  You have vision problems.  You develop confusion.  Your neck is stiff.  You have trouble breathing. Summary  Sinusitis is soreness and inflammation of your sinuses. Sinuses are hollow spaces in the bones around your face.  This condition is caused by nasal tissues that become inflamed or swollen. The swelling traps or blocks the flow of mucus. This allows bacteria, viruses, and fungi to grow, which leads to infection.  If you were prescribed an antibiotic medicine, take it as told by your health care provider. Do not stop taking the antibiotic even if you start to feel better.  Keep all follow-up visits as told by your health care provider. This is important. This information is not intended to replace advice given to you by your health care provider. Make sure you discuss any questions you have with your health care provider. Document Revised: 01/13/2018 Document Reviewed: 01/13/2018 Elsevier Patient Education  2020 [employer]  

## 2020-02-19 NOTE — Progress Notes (Signed)
Subjective:    Patient ID: Frank Hayden, male    DOB: Jun 12, 1956, 64 y.o.   MRN: [id number]  Chief Complaint  Patient presents with  . Medical Management of Chronic Issues    wants you to look at neck   . Sinusitis    wants something for it    Pt presents to the office today to establish care. He had a meningioma partially removed in 02/1999 that involved is right sinus cavity, optic  nerve, and carotid artery. He completed radiation. He is followed by a Neurosurgeon annually to continue to monitor and has another meningioma .   He reports he gets recurrent sinusitis related to this past surgery. He is followed by Dermatologists every 6 months.  Sinusitis This is a chronic problem. The current episode started more than 1 month ago. The problem has been waxing and waning since onset. There has been no fever. His pain is at a severity of 5/10. The pain is moderate. Associated symptoms include congestion, coughing, ear pain, headaches, sinus pressure and sneezing. Pertinent negatives include no sore throat. Past treatments include oral decongestants and acetaminophen. The treatment provided mild relief.  Insomnia Primary symptoms: difficulty falling asleep, frequent awakening.  The current episode started more than one year.      Review of Systems  HENT: Positive for congestion, ear pain, sinus pressure and sneezing. Negative for sore throat.   Respiratory: Positive for cough.   Neurological: Positive for headaches.  Psychiatric/Behavioral: The patient has insomnia.   All other systems reviewed and are negative.      Objective:   Physical Exam Vitals reviewed.  Constitutional:      General: He is not in acute distress.    Appearance: He is well-developed.  HENT:     Head: Normocephalic.     Right Ear: Tympanic membrane normal.     Left Ear: Tympanic membrane normal.     Nose:     Right Sinus: Maxillary sinus tenderness present.  Eyes:     General:        Right eye: No  discharge.        Left eye: No discharge.     Pupils: Pupils are equal, round, and reactive to light.  Neck:     Thyroid: No thyromegaly.  Cardiovascular:     Rate and Rhythm: Normal rate and regular rhythm.     Heart sounds: Normal heart sounds. No murmur heard.   Pulmonary:     Effort: Pulmonary effort is normal. No respiratory distress.     Breath sounds: Normal breath sounds. No wheezing.  Abdominal:     General: Bowel sounds are normal. There is no distension.     Palpations: Abdomen is soft.     Tenderness: There is no abdominal tenderness.  Musculoskeletal:        General: No tenderness. Normal range of motion.     Cervical back: Normal range of motion and neck supple.  Skin:    General: Skin is warm and dry.     Findings: No erythema or rash.  Neurological:     Mental Status: He is alert and oriented to person, place, and time.     Cranial Nerves: No cranial nerve deficit.     Deep Tendon Reflexes: Reflexes are normal and symmetric.  Psychiatric:        Behavior: Behavior normal.        Thought Content: Thought content normal.        Judgment:  Judgment normal.          There were no vitals taken for this visit.  Assessment & Plan:  Frank Hayden comes in today with chief complaint of Medical Management of Chronic Issues (wants you to look at neck ) and Sinusitis (wants something for it )   Diagnosis and orders addressed:  1. Primary insomnia - ALPRAZolam (XANAX) 1 MG tablet; Take 1 tablet (1 mg total) by mouth at bedtime.  Dispense: 30 tablet; Refill: 3 - CMP14+EGFR - CBC with Differential/Platelet  2. Controlled substance agreement signed - ALPRAZolam (XANAX) 1 MG tablet; Take 1 tablet (1 mg total) by mouth at bedtime.  Dispense: 30 tablet; Refill: 3 - CMP14+EGFR - CBC with Differential/Platelet - ToxASSURE Select 13 (MW), Urine  3. 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  Chronic sinusitis, unspecified location - CMP14+EGFR - CBC with Differential/Platelet - amoxicillin-clavulanate (AUGMENTIN) 875-125 MG tablet; Take 1 tablet by mouth 2 (two) times daily.  Dispense: 14 tablet; Refill: 0  5. Meningioma (HCC) - CMP14+EGFR - CBC with Differential/Platelet  6. Benzodiazepine dependence (HCC) - CMP14+EGFR - CBC with Differential/Platelet - ToxASSURE Select 13 (MW), Urine  7. Colon cancer screening - Cologuard - CMP14+EGFR - CBC with Differential/Platelet   Labs pending Patient reviewed in [place] controlled database, no flags noted. Contract and drug screen are up to dated today.  Health Maintenance reviewed Diet and exercise encouraged  Follow up plan: 4 months   Evelina Dun, FNP

## 2020-02-22 ENCOUNTER — Telehealth: Payer: Self-pay | Admitting: Family

## 2020-02-22 MED ORDER — DOXYCYCLINE HYCLATE 100 MG PO TABS: 100.0000 mg | ORAL_TABLET | Freq: Two times a day (BID) | ORAL | 0 refills | Status: DC

## 2020-02-22 NOTE — Addendum Note (Signed)
Addended by: Evelina Dun A on: 02/22/2020 04:41 PM   Modules accepted: Orders

## 2020-02-22 NOTE — Telephone Encounter (Signed)
Doxycycline Prescription sent to pharmacy   

## 2020-02-22 NOTE — Telephone Encounter (Signed)
Requesting change of medications.

## 2020-02-22 NOTE — Telephone Encounter (Signed)
Patient aware and verbalized understanding. °

## 2020-02-22 NOTE — Telephone Encounter (Signed)
Pts wife states that the amoxicillin-clavulanate (AUGMENTIN) 875-125 MG tablet Has left blotching on his skin and having diarrhea. She would like to see if something else can be sent in for him. [employer].

## 2020-02-26 LAB — TOXASSURE SELECT 13 (MW), URINE

## 2020-03-25 DIAGNOSIS — Z85828 Personal history of other malignant neoplasm of skin: Secondary | ICD-10-CM | POA: Diagnosis not present

## 2020-03-25 DIAGNOSIS — L905 Scar conditions and fibrosis of skin: Secondary | ICD-10-CM | POA: Diagnosis not present

## 2020-05-20 DIAGNOSIS — H168 Other keratitis: Secondary | ICD-10-CM | POA: Diagnosis not present

## 2020-05-20 DIAGNOSIS — H16231 Neurotrophic keratoconjunctivitis, right eye: Secondary | ICD-10-CM | POA: Diagnosis not present

## 2020-05-20 DIAGNOSIS — Z961 Presence of intraocular lens: Secondary | ICD-10-CM | POA: Diagnosis not present

## 2020-05-20 DIAGNOSIS — H538 Other visual disturbances: Secondary | ICD-10-CM | POA: Diagnosis not present

## 2020-05-27 ENCOUNTER — Encounter: Payer: Self-pay | Admitting: Nurse Practitioner

## 2020-05-27 ENCOUNTER — Other Ambulatory Visit: Payer: Self-pay

## 2020-05-27 ENCOUNTER — Ambulatory Visit (INDEPENDENT_AMBULATORY_CARE_PROVIDER_SITE_OTHER): Payer: BC Managed Care – PPO | Admitting: Nurse Practitioner

## 2020-05-27 VITALS — BP 138/77 | HR 69 | Temp 97.6°F | Ht 73.0 in | Wt 187.8 lb

## 2020-05-27 DIAGNOSIS — J0101 Acute recurrent maxillary sinusitis: Secondary | ICD-10-CM | POA: Diagnosis not present

## 2020-05-27 DIAGNOSIS — H9203 Otalgia, bilateral: Secondary | ICD-10-CM

## 2020-05-27 MED ORDER — NEOMYCIN-POLYMYXIN-HC 3.5-10000-1 OT SOLN: 4.0000 [drp] | Freq: Four times a day (QID) | OTIC | 0 refills | Status: DC

## 2020-05-27 MED ORDER — AMOXICILLIN-POT CLAVULANATE 875-125 MG PO TABS: 1.0000 | ORAL_TABLET | Freq: Two times a day (BID) | ORAL | 0 refills | Status: DC

## 2020-05-27 MED ORDER — ACETAMINOPHEN 500 MG PO TABS: 500.0000 mg | ORAL_TABLET | Freq: Four times a day (QID) | ORAL | 0 refills | Status: AC | PRN

## 2020-05-27 NOTE — Progress Notes (Signed)
Acute Office Visit  Subjective:    Patient ID: Frank Hayden, male    DOB: 1955/12/24, 64 y.o.   MRN: [id number]  Chief Complaint  Patient presents with  . Ear Drainage  . Sinusitis    Ear Drainage  There is pain in both ears. This is a recurrent problem. The current episode started 1 to 4 weeks ago. The problem occurs constantly. The problem has been unchanged. There has been no fever. The pain is mild. Associated symptoms include ear discharge, headaches and hearing loss. Pertinent negatives include no neck pain or sore throat. Associated symptoms comments: Sinus pressure. He has tried antibiotics for the symptoms. The treatment provided no relief. His past medical history is significant for a chronic ear infection.  Sinusitis Associated symptoms include congestion, ear pain, headaches and sinus pressure. Pertinent negatives include no neck pain or sore throat.     Past Medical History:  Diagnosis Date  . Anxiety    Has headaches associated with past surgery.  . Cancer ([place])    Brain   . Hearing loss    decreased 95% right / decreased 24% left ear    Past Surgical History:  Procedure Laterality Date  . APPENDECTOMY    . EYE SURGERY Right    Tumor removed behind eye.  . TONSILLECTOMY      Family History  Problem Relation Age of Onset  . Heart disease Mother        Heart enlarged and mechanical valve surgery.  . Hypertension Mother   . Heart disease Father        5 bypass  . Hypertension Father     Social History   Socioeconomic History  . Marital status: Married    Spouse name: Not on file  . Number of children: Not on file  . Years of education: Not on file  . Highest education level: Not on file  Occupational History  . Not on file  Tobacco Use  . Smoking status: Former Research scientist (life sciences)  . Smokeless tobacco: Never Used  . Tobacco comment: quit 40 years ago  Vaping Use  . Vaping Use: Never used  Substance and Sexual Activity  . Alcohol use: No  . Drug use:  No  . Sexual activity: Not on file  Other Topics Concern  . Not on file  Social History Narrative  . Not on file   Social Determinants of Health   Financial Resource Strain:   .   Food Insecurity:   .   Marland Kitchen   Transportation Needs:   .   Marland Kitchen   Physical Activity:   .   Marland Kitchen   Stress:   .   Social Connections:   .   .   .   .   .   .   Intimate Partner Violence:   .   .   .   .     Outpatient Medications Prior to Visit  Medication Sig Dispense Refill  . ALPRAZolam (XANAX) 1 MG tablet Take 1 tablet (1 mg total) by mouth at bedtime. 30 tablet 3  . aspirin (GOODSENSE ASPIRIN) 325 MG tablet Take 325 mg by mouth.    . Cholecalciferol (VITAMIN D) 2000 units CAPS Take by mouth.    . cycloSPORINE (RESTASIS) 0.05 % ophthalmic emulsion 1 drop 2 (two) times daily.    . fluorometholone (FML) 0.1 % ophthalmic suspension     . fluticasone (FLONASE) 50 MCG/ACT nasal spray SPRAY 1 SPRAY IN [facility]  NOSTRIL ONCE DAILY. 16 g 2  . levocetirizine (XYZAL) 5 MG tablet Take 5 mg by mouth every evening.    . loratadine (CLARITIN) 10 MG tablet Take by mouth.    . Multiple Vitamin (MULTIVITAMIN) capsule Take by mouth.    . naproxen sodium (ALEVE) 220 MG tablet Take by mouth.    Marland Kitchen olopatadine (PATANOL) 0.1 % ophthalmic solution 1 drop 2 times daily.    . Potassium 99 MG TABS Take by mouth.    . vitamin B-12 (CYANOCOBALAMIN) 1000 MCG tablet Take 1,000 mcg by mouth daily.    . vitamin E 400 UNIT capsule Take 400 Units by mouth daily.    . Zinc Sulfate (ZINC 15 PO) Take by mouth.    . doxycycline (VIBRA-TABS) 100 MG tablet Take 1 tablet (100 mg total) by mouth 2 (two) times daily. (Patient not taking: Reported on 05/27/2020) 20 tablet 0   No facility-administered medications prior to visit.    Allergies  Allergen Reactions  . Atorvastatin Other (See Comments)    Muscle aches  . Carbamazepine Nausea And Vomiting    Chest tightness Chest tightness   . Doxycycline Diarrhea  . Pregabalin Nausea And  Vomiting  . Latex Dermatitis    Review of Systems  HENT: Positive for congestion, ear discharge, ear pain, hearing loss and sinus pressure. Negative for sore throat.   Musculoskeletal: Negative for neck pain.  Neurological: Positive for headaches.  All other systems reviewed and are negative.      Objective:    Physical Exam Vitals reviewed.  Constitutional:      Appearance: Normal appearance.  HENT:     Ears:     Comments: Ear pain, right ear drainage,     Nose: Congestion present.     Mouth/Throat:     Pharynx: No posterior oropharyngeal erythema.  Eyes:     Conjunctiva/sclera: Conjunctivae normal.  Cardiovascular:     Rate and Rhythm: Normal rate and regular rhythm.     Pulses: Normal pulses.     Heart sounds: Normal heart sounds.  Pulmonary:     Effort: Pulmonary effort is normal.     Breath sounds: Normal breath sounds.  Abdominal:     General: Bowel sounds are normal.  Musculoskeletal:        General: Normal range of motion.     Cervical back: Normal range of motion. No tenderness.  Skin:    General: Skin is warm.  Neurological:     Mental Status: He is alert and oriented to person, place, and time.  Psychiatric:        Mood and Affect: Mood normal.        Behavior: Behavior normal.     BP 138/77   Pulse 69   Temp 97.6 F (36.4 C) (Temporal)   Ht 6\' 1"  (1.854 m)   Wt 187 lb 12.8 oz (85.2 kg)   SpO2 99%   BMI 24.78 kg/m  Wt Readings from Last 3 Encounters:  05/27/20 187 lb 12.8 oz (85.2 kg)  11/18/19 190 lb (86.2 kg)  05/15/19 192 lb 3.2 oz (87.2 kg)    Health Maintenance Due  Topic Date Due  . COLONOSCOPY  Never done  . COVID-19 Vaccine (2 - Pfizer 2-dose series) 11/22/2019  . PNA vac Low Risk Adult (1 of 2 - PCV13) Never done  . INFLUENZA VACCINE  Never done      Lab Results  Component Value Date   TSH 1.430 02/11/2017   Lab  Results  Component Value Date   WBC 5.8 05/22/2019   HGB 16.2 05/22/2019   HCT 48.2 05/22/2019   MCV 86  05/22/2019   PLT 202 05/22/2019   Lab Results  Component Value Date   NA 142 05/22/2019   K 4.4 05/22/2019   CO2 25 05/22/2019   GLUCOSE 93 05/22/2019   BUN 19 05/22/2019   CREATININE 0.98 05/22/2019   BILITOT 0.6 05/22/2019   ALKPHOS 73 05/22/2019   AST 16 05/22/2019   ALT 13 05/22/2019   PROT 6.8 05/22/2019   ALBUMIN 4.6 05/22/2019   CALCIUM 9.5 05/22/2019   Lab Results  Component Value Date   CHOL 280 (H) 07/31/2019   Lab Results  Component Value Date   HDL 40 07/31/2019   Lab Results  Component Value Date   LDLCALC 199 (H) 07/31/2019   Lab Results  Component Value Date   TRIG 210 (H) 07/31/2019   Lab Results  Component Value Date   CHOLHDL 7.0 (H) 07/31/2019   No results found for: HGBA1C     Assessment & Plan:   Problem List Items Addressed This Visit      Respiratory   Acute recurrent maxillary sinusitis - Primary    Acute recurrent maxillary sinusitis is not well managed.  Provided education to patient with printed handouts given.  Started patient on Augmentin.  Tylenol, and Flonase.  Future plan is to return to ENT for reevaluation.  Medication sent to pharmacy  Follow-up with worsening or unresolved symptoms.      Relevant Medications   amoxicillin-clavulanate (AUGMENTIN) 875-125 MG tablet     Other   Otalgia of both ears    Otalgia of both ears progressively worsened in the last 4 weeks.  Patient has been on antibiotics in the last 4 months off and on.  Today patient is reporting sinus pressure, right ear fluid drainage, headache but denies fever.  Provided education to patient with printed handouts given.  Advised patient to increase fluid, Flonase, eardrops as prescribed, Augmentin, Tylenol. Follow-up with worsening or unresolved symptoms. Rx sent to pharmacy          Meds ordered this encounter  Medications  . amoxicillin-clavulanate (AUGMENTIN) 875-125 MG tablet    Sig: Take 1 tablet by mouth 2 (two) times daily.    Dispense:  28  tablet    Refill:  0    Order Specific Question:   Supervising Provider    Answer:   Caryl Pina A [id number]  . neomycin-polymyxin-hydrocortisone (CORTISPORIN) OTIC solution    Sig: Place 4 drops into both ears 4 (four) times daily.    Dispense:  10 mL    Refill:  0    Order Specific Question:   Supervising Provider    Answer:   Caryl Pina A [id number]  . acetaminophen (TYLENOL) 500 MG tablet    Sig: Take 1 tablet (500 mg total) by mouth every 6 (six) hours as needed.    Dispense:  30 tablet    Refill:  0    Order Specific Question:   Supervising Provider    Answer:   Caryl Pina A [[id number]]     Ivy Lynn, NP

## 2020-05-27 NOTE — Patient Instructions (Addendum)
Sinusitis, Adult Sinusitis is soreness and swelling (inflammation) of your sinuses. Sinuses are hollow spaces in the bones around your face. They are located:  Around your eyes.  In the middle of your forehead.  Behind your nose.  In your cheekbones. Your sinuses and nasal passages are lined with a fluid called mucus. Mucus drains out of your sinuses. Swelling can trap mucus in your sinuses. This lets germs (bacteria, virus, or fungus) grow, which leads to infection. Most of the time, this condition is caused by a virus. What are the causes? This condition is caused by:  Allergies.  Asthma.  Germs.  Things that block your nose or sinuses.  Growths in the nose (nasal polyps).  Chemicals or irritants in the air.  Fungus (rare). What increases the risk? You are more likely to develop this condition if:  You have a weak body defense system (immune system).  You do a lot of swimming or diving.  You use nasal sprays too much.  You smoke. What are the signs or symptoms? The main symptoms of this condition are pain and a feeling of pressure around the sinuses. Other symptoms include: Stuf  Earache, Adult An earache, or ear pain, can be caused by many things, including:  An infection.  Ear wax buildup.  Ear pressure.  Something in the ear that should not be there (foreign body).  A sore throat.  Tooth problems.  Jaw problems. Treatment of the earache will depend on the cause. If the cause is not clear or cannot be determined, you may need to watch your symptoms until your earache goes away or until a cause is found. Follow these instructions at home: Medicines  Take or apply over-the-counter and prescription medicines only as told by your health care provider.  If you were prescribed an antibiotic medicine, use it as told by your health care provider. Do not stop using the antibiotic even if you start to feel better.  Do not put anything in your ear other than  medicine that is prescribed by your health care provider. Managing pain If directed, apply heat to the affected area as often as told by your health care provider. Use the heat source that your health care provider recommends, such as a moist heat pack or a heating pad.  Place a towel between your skin and the heat source.  Leave the heat on for 20-30 minutes.  Remove the heat if your skin turns bright red. This is especially important if you are unable to feel pain, heat, or cold. You may have a greater risk of getting burned. If directed, put ice on the affected area as often as told by your health care provider. To do this:      Put ice in a plastic bag.  Place a towel between your skin and the bag.  Leave the ice on for 20 minutes, 2-3 times a day. General instructions  Pay attention to any changes in your symptoms.  Try resting in an upright position instead of lying down. This may help to reduce pressure in your ear and relieve pain.  Chew gum if it helps to relieve your ear pain.  Treat any allergies as told by your health care provider.  Drink enough fluid to keep your urine pale yellow.  It is up to you to get the results of any tests that were done. Ask your health care provider, or the department that is doing the tests, when your results will be ready.  Keep all follow-up visits as told by your health care provider. This is important. Contact a health care provider if:  Your pain does not improve within 2 days.  Your earache gets worse.  You have new symptoms.  You have a fever. Get help right away if you:  Have a severe headache.  Have a stiff neck.  Have trouble swallowing.  Have redness or swelling behind your ear.  Have fluid or blood coming from your ear.  Have hearing loss.  Feel dizzy. Summary  An earache, or ear pain, can be caused by many things.  Treatment of the earache will depend on the cause. Follow recommendations from your  health care provider to treat your ear pain.  If the cause is not clear or cannot be determined, you may need to watch your symptoms until your earache goes away or until a cause is found.  Keep all follow-up visits as told by your health care provider. This is important. This information is not intended to replace advice given to you by your health care provider. Make sure you discuss any questions you have with your health care provider. Document Revised: 03/21/2019 Document Reviewed: 03/21/2019 Elsevier Patient Education  [place]. Summary  Sinusitis is swelling of your sinuses. Sinuses are hollow spaces in the bones around your face.  This condition is caused by tissues in your nose that become inflamed or swollen. This traps germs. These can lead to infection.  If you were prescribed an antibiotic medicine, take it as told by your doctor. Do not stop taking it even if you start to feel better.  Keep all follow-up visits as told by your doctor. This is important. This information is not intended to replace advice given to you by your health care provider. Make sure you discuss any questions you have with your health care provider. Document Revised: 01/13/2018 Document Reviewed: 01/13/2018 Elsevier Patient Education  [place].

## 2020-05-27 NOTE — Assessment & Plan Note (Signed)
Acute recurrent maxillary sinusitis is not well managed.  Provided education to patient with printed handouts given.  Started patient on Augmentin.  Tylenol, and Flonase.  Future plan is to return to ENT for reevaluation.  Medication sent to pharmacy  Follow-up with worsening or unresolved symptoms.

## 2020-05-27 NOTE — Assessment & Plan Note (Signed)
Otalgia of both ears progressively worsened in the last 4 weeks.  Patient has been on antibiotics in the last 4 months off and on.  Today patient is reporting sinus pressure, right ear fluid drainage, headache but denies fever.  Provided education to patient with printed handouts given.  Advised patient to increase fluid, Flonase, eardrops as prescribed, Augmentin, Tylenol. Follow-up with worsening or unresolved symptoms. Rx sent to pharmacy

## 2020-06-03 DIAGNOSIS — H4720 Unspecified optic atrophy: Secondary | ICD-10-CM | POA: Diagnosis not present

## 2020-06-03 DIAGNOSIS — H16231 Neurotrophic keratoconjunctivitis, right eye: Secondary | ICD-10-CM | POA: Diagnosis not present

## 2020-06-03 DIAGNOSIS — H168 Other keratitis: Secondary | ICD-10-CM | POA: Diagnosis not present

## 2020-06-03 DIAGNOSIS — Z961 Presence of intraocular lens: Secondary | ICD-10-CM | POA: Diagnosis not present

## 2020-06-08 DIAGNOSIS — H16211 Exposure keratoconjunctivitis, right eye: Secondary | ICD-10-CM | POA: Diagnosis not present

## 2020-06-08 DIAGNOSIS — Z961 Presence of intraocular lens: Secondary | ICD-10-CM | POA: Diagnosis not present

## 2020-06-08 DIAGNOSIS — H04121 Dry eye syndrome of right lacrimal gland: Secondary | ICD-10-CM | POA: Diagnosis not present

## 2020-06-08 DIAGNOSIS — H02411 Mechanical ptosis of right eyelid: Secondary | ICD-10-CM | POA: Diagnosis not present

## 2020-06-13 DIAGNOSIS — Z1211 Encounter for screening for malignant neoplasm of colon: Secondary | ICD-10-CM | POA: Diagnosis not present

## 2020-06-21 LAB — EXTERNAL GENERIC LAB PROCEDURE: COLOGUARD: NEGATIVE

## 2020-06-21 LAB — COLOGUARD: COLOGUARD: NEGATIVE

## 2020-06-22 DIAGNOSIS — H16211 Exposure keratoconjunctivitis, right eye: Secondary | ICD-10-CM | POA: Diagnosis not present

## 2020-06-22 DIAGNOSIS — H16231 Neurotrophic keratoconjunctivitis, right eye: Secondary | ICD-10-CM | POA: Diagnosis not present

## 2020-06-22 DIAGNOSIS — H04121 Dry eye syndrome of right lacrimal gland: Secondary | ICD-10-CM | POA: Diagnosis not present

## 2020-06-22 DIAGNOSIS — Z961 Presence of intraocular lens: Secondary | ICD-10-CM | POA: Diagnosis not present

## 2020-06-24 ENCOUNTER — Other Ambulatory Visit: Payer: Self-pay

## 2020-06-24 ENCOUNTER — Ambulatory Visit (INDEPENDENT_AMBULATORY_CARE_PROVIDER_SITE_OTHER): Payer: BC Managed Care – PPO | Admitting: Family

## 2020-06-24 ENCOUNTER — Encounter: Payer: Self-pay | Admitting: Family

## 2020-06-24 VITALS — BP 133/71 | HR 69 | Temp 97.1°F | Ht 73.0 in | Wt 189.2 lb

## 2020-06-24 DIAGNOSIS — D329 Benign neoplasm of meninges, unspecified: Secondary | ICD-10-CM

## 2020-06-24 DIAGNOSIS — F5101 Primary insomnia: Secondary | ICD-10-CM

## 2020-06-24 DIAGNOSIS — Z114 Encounter for screening for human immunodeficiency virus [HIV]: Secondary | ICD-10-CM | POA: Diagnosis not present

## 2020-06-24 DIAGNOSIS — F1320 Sedative, hypnotic or anxiolytic dependence, uncomplicated: Secondary | ICD-10-CM

## 2020-06-24 DIAGNOSIS — Z79899 Other long term (current) drug therapy: Secondary | ICD-10-CM

## 2020-06-24 DIAGNOSIS — H903 Sensorineural hearing loss, bilateral: Secondary | ICD-10-CM

## 2020-06-24 MED ORDER — ALPRAZOLAM 1 MG PO TABS: 1.0000 mg | ORAL_TABLET | Freq: Every day | ORAL | 3 refills | Status: DC

## 2020-06-24 NOTE — Patient Instructions (Signed)
Corneal Ulcer The cornea is the clear covering at the front and center of the eye. The cornea protects the eye and helps to focus vision. A corneal ulcer is an open sore on the cornea. Corneal ulcers can lead to scarring. A scarred cornea can affect vision. Corneal ulcers may cause permanent damage if they are not treated. What are the causes? The most common causes of this condition is an infection. The infection may be caused by:  Bacteria.  Viruses.  Fungi.  Parasites. Factors that can lead to this condition include:  A foreign body in the eye, such as sand, glass, or small pieces of metal.  Dry eye syndrome.  Certain conditions that prevent the eyelids from closing completely, such as Bell's palsy.  An eye injury.  Contact lenses. What increases the risk? The following factors may make you more likely to develop this condition:  Wearing your contact lenses for too long, wearing them overnight, or not taking care of them properly.  Having a weakened disease-fighting (immune) system.  Having a history of cold sores, chicken pox, or shingles around the eye.  Using steroid eye drops. What are the signs or symptoms? Symptoms of this condition include:  Eye pain. The pain is often severe.  Blurry vision and sensitivity to light.  Pus or thick discharge coming from your eye.  Eye redness.  Feeling like something is in your eye, or having a watery eye.  A burning or stinging feeling. When ulcers get large, they may be seen as a white spot on the cornea. How is this diagnosed? This condition is diagnosed based on your symptoms and medical history as well as an eye exam. Your health care provider may use a type of microscope (slit lamp) to examine your cornea. Eye drops may be put into your eye to make the ulcer easier to see. Tissue samples or cultures from the eye may be done to see if an infection is causing the corneal ulcer. Numbing eye drops will be given before any  samples or cultures are taken. The samples or cultures will be checked in the lab for organisms such as bacteria, viruses, fungi, or parasites. How is this treated? Treatment for this condition depends on the cause. You may be given antibiotic eye drops until your health care provider knows the test results. At first, the antibiotic eye drops may be given very frequently, sometimes even around the clock. Other treatments may include:  Antibiotic medicines by mouth, or medicines in the form of ointments or eye drops, to treat infections caused by bacteria, viruses, fungi or parasites.  Over-the-counter or prescription pain medicine.  Steroid eye drops if the eye is inflamed and swollen.  An injection of medicine under the thin membrane covering the eyeball (conjunctiva). This allows medicine to reach the ulcer in high doses.  Removal of the foreign body that caused the eye injury.  Artificial tears or a bandage contact lens if severe dry eyes caused the corneal ulcer.  Wearing an eye patch, if told by your heath care provider. If the corneal ulcer causes a scar on the cornea that interferes with vision, surgery may be needed to replace the cornea (corneal transplant) long after the infection has resolved. Follow these instructions at home: Medicines  Take or apply any over-the-counter and prescription medicines only as told by your health care provider.  If you were prescribed an antibiotic medicine, including pills, eye drops, or ointment, use it as told by your health care  provider. Do not stop using the antibiotic even if you start to feel better. ? You may have to apply eye drops every few minutes to every hour for several days. ? It may be necessary to set an alarm every few minutes during the day to every hour during the night so that you can apply eye drops.  Use artificial tears as needed if you have dry eyes.  Ask your health care provider if the medicine prescribed to  you: ? Requires you to avoid driving or using heavy machinery. ? Can cause constipation. You may need to take these actions to prevent or treat constipation:  Drink enough fluid to keep your urine pale yellow.  Take over-the-counter or prescription medicine.  Eat foods that are high in fiber, such as beans, whole grains, and fresh fruits and vegetables.  Limit foods that are high in fat and processed sugars, such as fried or sweet foods. Lifestyle  Avoid wearing eye makeup.  Avoid bright lights. Use sunglasses if you have light sensitivity.  Do not wear contact lenses until your health care provider approves.  Do not drive or operate heavy machinery until your health care provider approves. Do not drive or use machinery while wearing an eye patch. Your ability to judge distances will be impaired. General instructions  Do not touch or rub your eye. This may increase the irritation and spread the infection.  If directed, wear your eye patch as told.  Apply cool packs to your eye to relieve discomfort and swelling.  If you have an infection, wash your hands often with soap and water. If soap and water are not available, use hand sanitizer.  Keep all follow-up visits as told by your health care provider. This is important. How is this prevented?  If you normally wear contact lenses: ? Do not wear contact lenses while you sleep. ? Do not swim while wearing contact lenses. ? Wash your hands before removing your contact lenses. ? Properly sterilize and store your contact lenses. ? Regularly clean your contact lens case. ? Do not use your saliva or tap water to clean or wet your contact lenses. ? Remove your contact lenses if your eyes become irritated. You may put them back in once your eyes feel better. Where to find more information  American Academy of Ophthalmology: WhyNotPoker.uy Contact a health care provider if:  Your pain gets worse.  You have more discharge coming from  your eye. Get help right away if:  You have a change in vision.  Your eyelids or the skin around them develops worse redness or swelling. Summary  A corneal ulcer is an open sore on the cornea that can cause corneal scarring and vision problems.  Severe eye pain is a common symptom of a corneal ulcer.  Treatment for a corneal ulcer depends on the cause. It may include pain medicines and antibiotic medicines by mouth, or medicines in the form of ointments or eye drops, to treat infections caused by bacteria, viruses, fungi, or parasites. This information is not intended to replace advice given to you by your health care provider. Make sure you discuss any questions you have with your health care provider. Document Revised: 03/18/2019 Document Reviewed: 03/18/2019 Elsevier Patient Education  [employer].

## 2020-06-24 NOTE — Progress Notes (Signed)
Subjective:    Patient ID: EITHEN CASTIGLIA, male    DOB: 1956/04/20, 64 y.o.   MRN: [id number]  Chief Complaint  Patient presents with  . Medical Management of Chronic Issues   Pt presents to the office today to establish care. He had a meningioma partially removed in 02/1999 that involved is right sinus cavity, optic  nerve, and carotid artery. He completed radiation. He is followed by a Neurosurgeon annually to continue to monitor and has another meningioma . He takes 1 mg Xanax to help with nerve pain on side of face.   He is followed by Dermatologists every 6 months. He is followed by Ophthalmologist and was just recently diagnosed with an ulcer on his cornea.   Insomnia Primary symptoms: difficulty falling asleep, frequent awakening.  The current episode started more than one year. The onset quality is gradual. The problem occurs intermittently. The problem has been waxing and waning since onset.      Review of Systems  Psychiatric/Behavioral: The patient has insomnia.        Objective:   Physical Exam Vitals reviewed.  Constitutional:      General: He is not in acute distress.    Appearance: He is well-developed.  HENT:     Head: Normocephalic.     Right Ear: Tympanic membrane normal.     Left Ear: Tympanic membrane normal.  Eyes:     General:        Right eye: No discharge.        Left eye: No discharge.  Neck:     Thyroid: No thyromegaly.  Cardiovascular:     Rate and Rhythm: Normal rate and regular rhythm.     Heart sounds: Normal heart sounds. No murmur heard.   Pulmonary:     Effort: Pulmonary effort is normal. No respiratory distress.     Breath sounds: Wheezing present.  Abdominal:     General: Bowel sounds are normal. There is no distension.     Palpations: Abdomen is soft.     Tenderness: There is no abdominal tenderness.  Musculoskeletal:        General: No tenderness. Normal range of motion.     Cervical back: Normal range of motion and neck  supple.  Skin:    General: Skin is warm and dry.     Findings: No erythema or rash.  Neurological:     Mental Status: He is alert and oriented to person, place, and time.     Cranial Nerves: No cranial nerve deficit.     Deep Tendon Reflexes: Reflexes are normal and symmetric.  Psychiatric:        Behavior: Behavior normal.        Thought Content: Thought content normal.        Judgment: Judgment normal.          BP 133/71   Pulse 69   Temp (!) 97.1 F (36.2 C) (Temporal)   Ht $R'6\' 1"'Ba$  (1.854 m)   Wt 189 lb 3.2 oz (85.8 kg)   SpO2 99%   BMI 24.96 kg/m   Assessment & Plan:  OSWELL SAY comes in today with chief complaint of Medical Management of Chronic Issues   Diagnosis and orders addressed:  1. Meningioma ([place]) - CMP14+EGFR - CBC with Differential/Platelet  2. 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 Sensorineural hearing loss (SNHL), bilateral - CMP14+EGFR - CBC with Differential/Platelet  4.  Benzodiazepine dependence (HCC) - CMP14+EGFR - CBC with Differential/Platelet  5. Controlled substance agreement signed - ALPRAZolam (XANAX) 1 MG tablet; Take 1 tablet (1 mg total) by mouth at bedtime.  Dispense: 30 tablet; Refill: 3 - CMP14+EGFR - CBC with Differential/Platelet  6. Primary insomnia - ALPRAZolam (XANAX) 1 MG tablet; Take 1 tablet (1 mg total) by mouth at bedtime.  Dispense: 30 tablet; Refill: 3 - CMP14+EGFR - CBC with Differential/Platelet  7. Encounter for screening for HIV - CMP14+EGFR - CBC with Differential/Platelet - HIV Antibody (routine testing w rflx)   Labs pending Patient reviewed in [place] controlled database, no flags noted. Contract and drug screen are up to date.  Health Maintenance reviewed- Pt states he has completed Cologuard. Will get nurse to verify and scan into chart Diet and exercise encouraged  Follow up plan: 3 months    Evelina Dun,  FNP

## 2020-06-25 LAB — HIV ANTIBODY (ROUTINE TESTING W REFLEX): HIV Screen 4th Generation wRfx: NONREACTIVE

## 2020-06-25 LAB — CBC WITH DIFFERENTIAL/PLATELET
Basophils Absolute: 0 10*3/uL (ref 0.0–0.2)
Basos: 1 %
EOS (ABSOLUTE): 0.3 10*3/uL (ref 0.0–0.4)
Eos: 5 %
Hematocrit: 47.7 % (ref 37.5–51.0)
Hemoglobin: 16.2 g/dL (ref 13.0–17.7)
Immature Grans (Abs): 0 10*3/uL (ref 0.0–0.1)
Immature Granulocytes: 0 %
Lymphocytes Absolute: 0.7 10*3/uL (ref 0.7–3.1)
Lymphs: 14 %
MCH: 29.6 pg (ref 26.6–33.0)
MCHC: 34 g/dL (ref 31.5–35.7)
MCV: 87 fL (ref 79–97)
Monocytes Absolute: 0.4 10*3/uL (ref 0.1–0.9)
Monocytes: 7 %
Neutrophils Absolute: 3.9 10*3/uL (ref 1.4–7.0)
Neutrophils: 73 %
Platelets: 181 10*3/uL (ref 150–450)
RBC: 5.47 x10E6/uL (ref 4.14–5.80)
RDW: 13.8 % (ref 11.6–15.4)
WBC: 5.2 10*3/uL (ref 3.4–10.8)

## 2020-06-25 LAB — CMP14+EGFR
ALT: 15 IU/L (ref 0–44)
AST: 18 IU/L (ref 0–40)
Albumin/Globulin Ratio: 2.1 (ref 1.2–2.2)
Albumin: 4.6 g/dL (ref 3.8–4.8)
Alkaline Phosphatase: 68 IU/L (ref 44–121)
BUN/Creatinine Ratio: 16 (ref 10–24)
BUN: 15 mg/dL (ref 8–27)
Bilirubin Total: 0.5 mg/dL (ref 0.0–1.2)
CO2: 23 mmol/L (ref 20–29)
Calcium: 9.8 mg/dL (ref 8.6–10.2)
Chloride: 101 mmol/L (ref 96–106)
Creatinine, Ser: 0.94 mg/dL (ref 0.76–1.27)
GFR calc Af Amer: 98 mL/min/{1.73_m2} (ref >59)
GFR calc non Af Amer: 85 mL/min/{1.73_m2} (ref >59)
Globulin, Total: 2.2 g/dL (ref 1.5–4.5)
Glucose: 80 mg/dL (ref 65–99)
Potassium: 4.6 mmol/L (ref 3.5–5.2)
Sodium: 140 mmol/L (ref 134–144)
Total Protein: 6.8 g/dL (ref 6.0–8.5)

## 2020-07-04 LAB — COLOGUARD: Cologuard: NEGATIVE

## 2020-08-01 DIAGNOSIS — H16211 Exposure keratoconjunctivitis, right eye: Secondary | ICD-10-CM | POA: Diagnosis not present

## 2020-08-01 DIAGNOSIS — H16231 Neurotrophic keratoconjunctivitis, right eye: Secondary | ICD-10-CM | POA: Diagnosis not present

## 2020-08-01 DIAGNOSIS — H04121 Dry eye syndrome of right lacrimal gland: Secondary | ICD-10-CM | POA: Diagnosis not present

## 2020-08-01 DIAGNOSIS — J329 Chronic sinusitis, unspecified: Secondary | ICD-10-CM | POA: Diagnosis not present

## 2020-09-05 DIAGNOSIS — H16231 Neurotrophic keratoconjunctivitis, right eye: Secondary | ICD-10-CM | POA: Diagnosis not present

## 2020-09-05 DIAGNOSIS — J329 Chronic sinusitis, unspecified: Secondary | ICD-10-CM | POA: Diagnosis not present

## 2020-09-05 DIAGNOSIS — H16211 Exposure keratoconjunctivitis, right eye: Secondary | ICD-10-CM | POA: Diagnosis not present

## 2020-09-05 DIAGNOSIS — H04121 Dry eye syndrome of right lacrimal gland: Secondary | ICD-10-CM | POA: Diagnosis not present

## 2020-09-07 ENCOUNTER — Ambulatory Visit (INDEPENDENT_AMBULATORY_CARE_PROVIDER_SITE_OTHER): Payer: BC Managed Care – PPO | Admitting: Family Medicine

## 2020-09-07 ENCOUNTER — Encounter: Payer: Self-pay | Admitting: Family Medicine

## 2020-09-07 DIAGNOSIS — R059 Cough, unspecified: Secondary | ICD-10-CM

## 2020-09-07 DIAGNOSIS — J019 Acute sinusitis, unspecified: Secondary | ICD-10-CM

## 2020-09-07 MED ORDER — PREDNISONE 10 MG (21) PO TBPK: ORAL_TABLET | ORAL | 0 refills | Status: DC

## 2020-09-07 MED ORDER — BENZONATATE 100 MG PO CAPS: 100.0000 mg | ORAL_CAPSULE | Freq: Three times a day (TID) | ORAL | 0 refills | Status: DC | PRN

## 2020-09-07 MED ORDER — DOXYCYCLINE HYCLATE 100 MG PO TABS: 100.0000 mg | ORAL_TABLET | Freq: Two times a day (BID) | ORAL | 0 refills | Status: AC

## 2020-09-07 NOTE — Progress Notes (Signed)
Virtual Visit via Telephone Note  I connected with Frank Hayden on 09/07/20 at 4:25 PM by telephone and verified that I am speaking with the correct person using two identifiers. Frank Hayden is currently located at home and his wife is currently with him during this visit. The provider, Loman Brooklyn, FNP is located in their office at time of visit.  I discussed the limitations, risks, security and privacy concerns of performing an evaluation and management service by telephone and the availability of in person appointments. I also discussed with the patient that there may be a patient responsible charge related to this service. The patient expressed understanding and agreed to proceed.  Subjective: PCP: Sharion Balloon, FNP  Chief Complaint  Patient presents with  . URI   Patient complains of cough, head congestion and postnasal drainage. Additional symptoms include headache, runny nose, sneezing, ear pain/pressure and facial pain/pressure. Onset of symptoms was a few days ago, unchanged since that time. He is drinking plenty of fluids. Evaluation to date: none. Treatment to date: Mucinex. He does not smoke. Patient has been vaccinated against [id number] with [employer]. Patient reports history of sinus infections when the seasons change.    ROS: Per HPI  Current Outpatient Medications:  .  acetaminophen (TYLENOL) 500 MG tablet, Take 1 tablet (500 mg total) by mouth every 6 (six) hours as needed., Disp: 30 tablet, Rfl: 0 .  ALPRAZolam (XANAX) 1 MG tablet, Take 1 tablet (1 mg total) by mouth at bedtime., Disp: 30 tablet, Rfl: 3 .  aspirin (GOODSENSE ASPIRIN) 325 MG tablet, Take 325 mg by mouth., Disp: , Rfl:  .  Bepotastine Besilate (BEPREVE) 1.5 % SOLN, , Disp: , Rfl:  .  Cenegermin-bkbj (OXERVATE) 0.002 % SOLN, Apply to eye., Disp: , Rfl:  .  Cholecalciferol (VITAMIN D) 2000 units CAPS, Take by mouth., Disp: , Rfl:  .  cycloSPORINE (RESTASIS) 0.05 % ophthalmic emulsion, 1 drop 2  (two) times daily., Disp: , Rfl:  .  fluorometholone (FML) 0.1 % ophthalmic suspension, , Disp: , Rfl:  .  fluticasone (FLONASE) 50 MCG/ACT nasal spray, SPRAY 1 SPRAY IN EACH NOSTRIL ONCE DAILY., Disp: 16 g, Rfl: 2 .  levocetirizine (XYZAL) 5 MG tablet, Take 5 mg by mouth every evening., Disp: , Rfl:  .  loratadine (CLARITIN) 10 MG tablet, Take by mouth., Disp: , Rfl:  .  Multiple Vitamin (MULTIVITAMIN) capsule, Take by mouth., Disp: , Rfl:  .  naproxen sodium (ALEVE) 220 MG tablet, Take by mouth., Disp: , Rfl:  .  neomycin-polymyxin-hydrocortisone (CORTISPORIN) OTIC solution, Place 4 drops into both ears 4 (four) times daily., Disp: 10 mL, Rfl: 0 .  olopatadine (PATANOL) 0.1 % ophthalmic solution, 1 drop 2 times daily., Disp: , Rfl:  .  Oxymetazoline HCl (UPNEEQ) 0.1 % SOLN, Apply to eye., Disp: , Rfl:  .  Potassium 99 MG TABS, Take by mouth., Disp: , Rfl:  .  vitamin B-12 (CYANOCOBALAMIN) 1000 MCG tablet, Take 1,000 mcg by mouth daily., Disp: , Rfl:  .  vitamin E 400 UNIT capsule, Take 400 Units by mouth daily., Disp: , Rfl:  .  Zinc Sulfate (ZINC 15 PO), Take by mouth., Disp: , Rfl:   Allergies  Allergen Reactions  . Atorvastatin Other (See Comments)    Muscle aches  . Carbamazepine Nausea And Vomiting    Chest tightness Chest tightness   . Doxycycline Diarrhea  . Pregabalin Nausea And Vomiting  . Latex Dermatitis   Past Medical  History:  Diagnosis Date  . Anxiety    Has headaches associated with past surgery.  . Cancer ([place])    Brain   . Hearing loss    decreased 95% right / decreased 24% left ear    Observations/Objective: A&O  No respiratory distress or wheezing audible over the phone Mood, judgement, and thought processes all WNL  Assessment and Plan: 1. Acute non-recurrent sinusitis, unspecified location - Discussed symptom management. Patient will also come for COVID-19 testing either tomorrow or Friday. - doxycycline (VIBRA-TABS) 100 MG tablet; Take 1 tablet  (100 mg total) by mouth 2 (two) times daily for 7 days.  Dispense: 14 tablet; Refill: 0 - predniSONE (STERAPRED UNI-PAK 21 TAB) 10 MG (21) TBPK tablet; As directed x 6 days  Dispense: 21 tablet; Refill: 0 - benzonatate (TESSALON PERLES) 100 MG capsule; Take 1 capsule (100 mg total) by mouth 3 (three) times daily as needed for cough.  Dispense: 20 capsule; Refill: 0  2. Cough - Novel Coronavirus, NAA ([facility]); Future   Follow Up Instructions:  I discussed the assessment and treatment plan with the patient. The patient was provided an opportunity to ask questions and all were answered. The patient agreed with the plan and demonstrated an understanding of the instructions.   The patient was advised to call back or seek an in-person evaluation if the symptoms worsen or if the condition fails to improve as anticipated.  The above assessment and management plan was discussed with the patient. The patient verbalized understanding of and has agreed to the management plan. Patient is aware to call the clinic if symptoms persist or worsen. Patient is aware when to return to the clinic for a follow-up visit. Patient educated on when it is appropriate to go to the emergency department.   Time call ended: 4:35 PM  I provided 12 minutes of non-face-to-face time during this encounter.  Hendricks Limes, MSN, APRN, FNP-C [facility] 09/07/20

## 2020-09-08 ENCOUNTER — Other Ambulatory Visit: Payer: Self-pay

## 2020-09-08 ENCOUNTER — Other Ambulatory Visit: Payer: BC Managed Care – PPO

## 2020-09-08 DIAGNOSIS — R059 Cough, unspecified: Secondary | ICD-10-CM | POA: Diagnosis not present

## 2020-09-08 NOTE — Addendum Note (Signed)
Addended by: Liliane Bade on: 09/08/2020 01:27 PM   Modules accepted: Orders

## 2020-09-09 DIAGNOSIS — Z961 Presence of intraocular lens: Secondary | ICD-10-CM | POA: Diagnosis not present

## 2020-09-09 DIAGNOSIS — H4720 Unspecified optic atrophy: Secondary | ICD-10-CM | POA: Diagnosis not present

## 2020-09-09 DIAGNOSIS — H168 Other keratitis: Secondary | ICD-10-CM | POA: Diagnosis not present

## 2020-09-09 DIAGNOSIS — H16231 Neurotrophic keratoconjunctivitis, right eye: Secondary | ICD-10-CM | POA: Diagnosis not present

## 2020-09-10 LAB — SARS-COV-2, NAA 2 DAY TAT

## 2020-09-10 LAB — NOVEL CORONAVIRUS, NAA: SARS-CoV-2, NAA: DETECTED — AB

## 2020-09-30 ENCOUNTER — Ambulatory Visit: Payer: BC Managed Care – PPO | Admitting: Family

## 2020-09-30 ENCOUNTER — Other Ambulatory Visit: Payer: Self-pay

## 2020-09-30 ENCOUNTER — Encounter: Payer: Self-pay | Admitting: Family

## 2020-09-30 VITALS — BP 126/77 | HR 86 | Temp 97.3°F | Ht 73.0 in | Wt 185.6 lb

## 2020-09-30 DIAGNOSIS — Z23 Encounter for immunization: Secondary | ICD-10-CM

## 2020-09-30 DIAGNOSIS — H903 Sensorineural hearing loss, bilateral: Secondary | ICD-10-CM

## 2020-09-30 DIAGNOSIS — D329 Benign neoplasm of meninges, unspecified: Secondary | ICD-10-CM

## 2020-09-30 DIAGNOSIS — Z79899 Other long term (current) drug therapy: Secondary | ICD-10-CM

## 2020-09-30 DIAGNOSIS — F1320 Sedative, hypnotic or anxiolytic dependence, uncomplicated: Secondary | ICD-10-CM | POA: Diagnosis not present

## 2020-09-30 DIAGNOSIS — J329 Chronic sinusitis, unspecified: Secondary | ICD-10-CM

## 2020-09-30 DIAGNOSIS — F5101 Primary insomnia: Secondary | ICD-10-CM

## 2020-09-30 MED ORDER — ALPRAZOLAM 1 MG PO TABS: 1.0000 mg | ORAL_TABLET | Freq: Every day | ORAL | 3 refills | Status: DC

## 2020-09-30 MED ORDER — DOXYCYCLINE HYCLATE 100 MG PO TABS: 100.0000 mg | ORAL_TABLET | Freq: Two times a day (BID) | ORAL | 0 refills | Status: DC

## 2020-09-30 NOTE — Progress Notes (Signed)
Subjective:    Patient ID: Frank Hayden, male    DOB: 1956/08/02, 65 y.o.   MRN: [id number]  Chief Complaint  Patient presents with  . Medical Management of Chronic Issues  . Sinus Problem    Pt presents to the office today for chronic follow up. He had a meningioma partially removed in 02/1999 that involved is right sinus cavity, optic nerve, and carotid artery. He completed radiation. He is followed by a Neurosurgeon annually to continue to monitor and has another meningioma.  He takes 1 mg Xanax to help with nerve pain on side of face.   He is followed by Dermatologists every 6 months.He is followed by Ophthalmologist and was just recently diagnosed with an ulcer on his cornea.    He had COVID last month and states he is doing much better, but continues to have congestion.  Sinus Problem This is a recurrent problem. The current episode started 1 to 4 weeks ago. The problem has been gradually worsening since onset. There has been no fever. His pain is at a severity of 7/10. The pain is moderate. Associated symptoms include congestion, coughing, ear pain, headaches, sinus pressure and a sore throat. Pertinent negatives include no shortness of breath or sneezing. Past treatments include oral decongestants. The treatment provided mild relief.  Insomnia Primary symptoms: difficulty falling asleep, frequent awakening.  The current episode started more than one year. The onset quality is gradual. The problem occurs intermittently.      Review of Systems  HENT: Positive for congestion, ear pain, sinus pressure and sore throat. Negative for sneezing.   Respiratory: Positive for cough. Negative for shortness of breath.   Neurological: Positive for headaches.  Psychiatric/Behavioral: The patient has insomnia.   All other systems reviewed and are negative.      Objective:   Physical Exam Vitals reviewed.  Constitutional:      General: He is not in acute distress.    Appearance:  He is well-developed and well-nourished.  HENT:     Head: Normocephalic.     Right Ear: Tympanic membrane normal.     Left Ear: Tympanic membrane normal.     Nose:     Right Turbinates: Enlarged.     Left Turbinates: Enlarged.     Mouth/Throat:     Mouth: Oropharynx is clear and moist.  Neck:     Thyroid: No thyromegaly.  Cardiovascular:     Rate and Rhythm: Normal rate and regular rhythm.     Pulses: Intact distal pulses.     Heart sounds: Normal heart sounds. No murmur heard.   Pulmonary:     Effort: Pulmonary effort is normal. No respiratory distress.     Breath sounds: Normal breath sounds. No wheezing.  Abdominal:     General: Bowel sounds are normal. There is no distension.     Palpations: Abdomen is soft.     Tenderness: There is no abdominal tenderness.  Musculoskeletal:        General: No tenderness or edema. Normal range of motion.     Cervical back: Normal range of motion and neck supple.  Skin:    General: Skin is warm and dry.     Findings: No erythema or rash.  Neurological:     Mental Status: He is alert and oriented to person, place, and time.     Cranial Nerves: No cranial nerve deficit.     Deep Tendon Reflexes: Reflexes are normal and symmetric.  Psychiatric:  Mood and Affect: Mood and affect normal.        Behavior: Behavior normal.        Thought Content: Thought content normal.        Judgment: Judgment normal.       BP 126/77   Pulse 86   Temp (!) 97.3 F (36.3 C) (Temporal)   Ht 6\' 1"  (1.854 m)   Wt 185 lb 9.6 oz (84.2 kg)   SpO2 96%   BMI 24.49 kg/m      Assessment & Plan:  Frank Hayden comes in today with chief complaint of Medical Management of Chronic Issues and Sinus Problem   Diagnosis and orders addressed:  1. Controlled substance agreement signed - ALPRAZolam (XANAX) 1 MG tablet; Take 1 tablet (1 mg total) by mouth at bedtime.  Dispense: 30 tablet; Refill: 3  2. Primary insomnia - ALPRAZolam (XANAX) 1 MG  tablet; Take 1 tablet (1 mg total) by mouth at bedtime.  Dispense: 30 tablet; Refill: 3  3. Meningioma ([place])  4. Meningioma of right sphenoid wing involving cavernous sinus (HCC)  5. Sensorineural hearing loss (SNHL), bilateral  6. Benzodiazepine dependence ([place])  7. Chronic sinusitis, unspecified location - doxycycline (VIBRA-TABS) 100 MG tablet; Take 1 tablet (100 mg total) by mouth 2 (two) times daily.  Dispense: 20 tablet; Refill: 0   Patient reviewed in [place] controlled database, no flags noted. Contract and drug screen are up to date.  Health Maintenance reviewed Diet and exercise encouraged  Follow up plan: 3 months    Evelina Dun, FNP

## 2020-09-30 NOTE — Patient Instructions (Signed)
Sinusitis, Adult Sinusitis is inflammation of your sinuses. Sinuses are hollow spaces in the bones around your face. Your sinuses are located:  Around your eyes.  In the middle of your forehead.  Behind your nose.  In your cheekbones. Mucus normally drains out of your sinuses. When your nasal tissues become inflamed or swollen, mucus can become trapped or blocked. This allows bacteria, viruses, and fungi to grow, which leads to infection. Most infections of the sinuses are caused by a virus. Sinusitis can develop quickly. It can last for up to 4 weeks (acute) or for more than 12 weeks (chronic). Sinusitis often develops after a cold. What are the causes? This condition is caused by anything that creates swelling in the sinuses or stops mucus from draining. This includes:  Allergies.  Asthma.  Infection from bacteria or viruses.  Deformities or blockages in your nose or sinuses.  Abnormal growths in the nose (nasal polyps).  Pollutants, such as chemicals or irritants in the air.  Infection from fungi (rare). What increases the risk? You are more likely to develop this condition if you:  Have a weak body defense system (immune system).  Do a lot of swimming or diving.  Overuse nasal sprays.  Smoke. What are the signs or symptoms? The main symptoms of this condition are pain and a feeling of pressure around the affected sinuses. Other symptoms include:  Stuffy nose or congestion.  Thick drainage from your nose.  Swelling and warmth over the affected sinuses.  Headache.  Upper toothache.  A cough that may get worse at night.  Extra mucus that collects in the throat or the back of the nose (postnasal drip).  Decreased sense of smell and taste.  Fatigue.  A fever.  Sore throat.  Bad breath. How is this diagnosed? This condition is diagnosed based on:  Your symptoms.  Your medical history.  A physical exam.  Tests to find out if your condition is  acute or chronic. This may include: ? Checking your nose for nasal polyps. ? Viewing your sinuses using a device that has a light (endoscope). ? Testing for allergies or bacteria. ? Imaging tests, such as an MRI or CT scan. In rare cases, a bone biopsy may be done to rule out more serious types of fungal sinus disease. How is this treated? Treatment for sinusitis depends on the cause and whether your condition is chronic or acute.  If caused by a virus, your symptoms should go away on their own within 10 days. You may be given medicines to relieve symptoms. They include: ? Medicines that shrink swollen nasal passages (topical intranasal decongestants). ? Medicines that treat allergies (antihistamines). ? A spray that eases inflammation of the nostrils (topical intranasal corticosteroids). ? Rinses that help get rid of thick mucus in your nose (nasal saline washes).  If caused by bacteria, your health care provider may recommend waiting to see if your symptoms improve. Most bacterial infections will get better without antibiotic medicine. You may be given antibiotics if you have: ? A severe infection. ? A weak immune system.  If caused by narrow nasal passages or nasal polyps, you may need to have surgery. Follow these instructions at home: Medicines  Take, use, or apply over-the-counter and prescription medicines only as told by your health care provider. These may include nasal sprays.  If you were prescribed an antibiotic medicine, take it as told by your health care provider. Do not stop taking the antibiotic even if you start   to feel better. Hydrate and humidify  Drink enough fluid to keep your urine pale yellow. Staying hydrated will help to thin your mucus.  Use a cool mist humidifier to keep the humidity level in your home above 50%.  Inhale steam for 10-15 minutes, 3-4 times a day, or as told by your health care provider. You can do this in the bathroom while a hot shower is  running.  Limit your exposure to cool or dry air.   Rest  Rest as much as possible.  Sleep with your head raised (elevated).  Make sure you get enough sleep each night. General instructions  Apply a warm, moist washcloth to your face 3-4 times a day or as told by your health care provider. This will help with discomfort.  Wash your hands often with soap and water to reduce your exposure to germs. If soap and water are not available, use hand sanitizer.  Do not smoke. Avoid being around people who are smoking (secondhand smoke).  Keep all follow-up visits as told by your health care provider. This is important.   Contact a health care provider if:  You have a fever.  Your symptoms get worse.  Your symptoms do not improve within 10 days. Get help right away if:  You have a severe headache.  You have persistent vomiting.  You have severe pain or swelling around your face or eyes.  You have vision problems.  You develop confusion.  Your neck is stiff.  You have trouble breathing. Summary  Sinusitis is soreness and inflammation of your sinuses. Sinuses are hollow spaces in the bones around your face.  This condition is caused by nasal tissues that become inflamed or swollen. The swelling traps or blocks the flow of mucus. This allows bacteria, viruses, and fungi to grow, which leads to infection.  If you were prescribed an antibiotic medicine, take it as told by your health care provider. Do not stop taking the antibiotic even if you start to feel better.  Keep all follow-up visits as told by your health care provider. This is important. This information is not intended to replace advice given to you by your health care provider. Make sure you discuss any questions you have with your health care provider. Document Revised: 01/13/2018 Document Reviewed: 01/13/2018 Elsevier Patient Education  2021 Elsevier Inc.  

## 2020-09-30 NOTE — Addendum Note (Signed)
Addended by: Ladean Raya on: 09/30/2020 08:41 AM   Modules accepted: Orders

## 2020-10-14 DIAGNOSIS — L308 Other specified dermatitis: Secondary | ICD-10-CM | POA: Diagnosis not present

## 2020-10-14 DIAGNOSIS — L821 Other seborrheic keratosis: Secondary | ICD-10-CM | POA: Diagnosis not present

## 2020-10-14 DIAGNOSIS — L905 Scar conditions and fibrosis of skin: Secondary | ICD-10-CM | POA: Diagnosis not present

## 2020-10-14 DIAGNOSIS — Z85828 Personal history of other malignant neoplasm of skin: Secondary | ICD-10-CM | POA: Diagnosis not present

## 2020-11-18 DIAGNOSIS — D329 Benign neoplasm of meninges, unspecified: Secondary | ICD-10-CM | POA: Diagnosis not present

## 2020-11-24 ENCOUNTER — Other Ambulatory Visit: Payer: Self-pay | Admitting: Family

## 2020-11-24 DIAGNOSIS — F5101 Primary insomnia: Secondary | ICD-10-CM

## 2020-11-24 DIAGNOSIS — Z79899 Other long term (current) drug therapy: Secondary | ICD-10-CM

## 2020-12-05 DIAGNOSIS — J329 Chronic sinusitis, unspecified: Secondary | ICD-10-CM | POA: Diagnosis not present

## 2020-12-05 DIAGNOSIS — H16211 Exposure keratoconjunctivitis, right eye: Secondary | ICD-10-CM | POA: Diagnosis not present

## 2020-12-05 DIAGNOSIS — H16231 Neurotrophic keratoconjunctivitis, right eye: Secondary | ICD-10-CM | POA: Diagnosis not present

## 2020-12-05 DIAGNOSIS — H04121 Dry eye syndrome of right lacrimal gland: Secondary | ICD-10-CM | POA: Diagnosis not present

## 2021-01-06 ENCOUNTER — Ambulatory Visit: Payer: BC Managed Care – PPO | Admitting: Family

## 2021-01-06 ENCOUNTER — Encounter: Payer: Self-pay | Admitting: Family

## 2021-01-06 ENCOUNTER — Other Ambulatory Visit: Payer: Self-pay

## 2021-01-06 VITALS — BP 139/79 | HR 53 | Temp 97.0°F | Resp 20 | Ht 73.0 in | Wt 187.5 lb

## 2021-01-06 DIAGNOSIS — F5101 Primary insomnia: Secondary | ICD-10-CM

## 2021-01-06 DIAGNOSIS — F1320 Sedative, hypnotic or anxiolytic dependence, uncomplicated: Secondary | ICD-10-CM | POA: Diagnosis not present

## 2021-01-06 DIAGNOSIS — Z79899 Other long term (current) drug therapy: Secondary | ICD-10-CM | POA: Diagnosis not present

## 2021-01-06 DIAGNOSIS — D329 Benign neoplasm of meninges, unspecified: Secondary | ICD-10-CM

## 2021-01-06 MED ORDER — ALPRAZOLAM 1 MG PO TABS: 1.0000 mg | ORAL_TABLET | Freq: Every day | ORAL | 3 refills | Status: DC

## 2021-01-06 NOTE — Progress Notes (Signed)
Subjective:    Patient ID: Frank Hayden, male    DOB: [birth year], 65 y.o.   MRN: [id number]  Chief Complaint  Patient presents with  . Medical Management of Chronic Issues   Pt presents to the office today for chronic follow up. He had a meningioma partially removed in 02/1999 that involved is right sinus cavity, optic nerve, and carotid artery. He completed radiation. He is followed by a Neurosurgeon annually to continue to monitor and has another meningioma. He takes 1 mg Xanax to help with nerve pain on side of face.  He is followed by Dermatologists every 6 months.He is followed by Ophthalmologist and was just recently diagnosed with an ulcer on his cornea. Sinus Problem This is a chronic problem. The current episode started more than 1 year ago. The problem has been waxing and waning since onset. There has been no fever. Associated symptoms include congestion, coughing, sinus pressure and sneezing. Pertinent negatives include no headaches, shortness of breath or sore throat. Past treatments include acetaminophen and oral decongestants. The treatment provided moderate relief.  Insomnia Primary symptoms: difficulty falling asleep, frequent awakening.  The current episode started more than one year. The onset quality is gradual. The problem has been rapidly worsening since onset.      Review of Systems  HENT: Positive for congestion, sinus pressure and sneezing. Negative for sore throat.   Respiratory: Positive for cough. Negative for shortness of breath.   Neurological: Negative for headaches.  Psychiatric/Behavioral: The patient has insomnia.   All other systems reviewed and are negative.      Objective:   Physical Exam Vitals reviewed.  Constitutional:      General: He is not in acute distress.    Appearance: He is well-developed.  HENT:     Head: Normocephalic.     Right Ear: Tympanic membrane normal.     Left Ear: Tympanic membrane normal.     Nose:      Right Sinus: Maxillary sinus tenderness and frontal sinus tenderness present.     Left Sinus: Maxillary sinus tenderness and frontal sinus tenderness present.  Eyes:     General:        Right eye: No discharge.        Left eye: No discharge.     Pupils: Pupils are equal, round, and reactive to light.  Neck:     Thyroid: No thyromegaly.  Cardiovascular:     Rate and Rhythm: Normal rate and regular rhythm.     Heart sounds: Normal heart sounds. No murmur heard.   Pulmonary:     Effort: Pulmonary effort is normal. No respiratory distress.     Breath sounds: Normal breath sounds. No wheezing.  Abdominal:     General: Bowel sounds are normal. There is no distension.     Palpations: Abdomen is soft.     Tenderness: There is no abdominal tenderness.  Musculoskeletal:        General: No tenderness. Normal range of motion.     Cervical back: Normal range of motion and neck supple.  Skin:    General: Skin is warm and dry.     Findings: No erythema or rash.  Neurological:     Mental Status: He is alert and oriented to person, place, and time.     Cranial Nerves: No cranial nerve deficit.     Deep Tendon Reflexes: Reflexes are normal and symmetric.  Psychiatric:        Behavior: Behavior normal.  Thought Content: Thought content normal.        Judgment: Judgment normal.          BP 139/79   Pulse (!) 53   Temp (!) 97 F (36.1 C) (Temporal)   Resp 20   Ht 6\' 1"  (1.854 m)   Wt 187 lb 8 oz (85 kg)   SpO2 100%   BMI 24.74 kg/m   Assessment & Plan:  Frank Hayden comes in today with chief complaint of Medical Management of Chronic Issues   Diagnosis and orders addressed:  1. Meningioma of right sphenoid wing involving cavernous sinus (HCC)  2. Meningioma ([place])  3. Primary insomnia - ALPRAZolam (XANAX) 1 MG tablet; Take 1 tablet (1 mg total) by mouth at bedtime.  Dispense: 30 tablet; Refill: 3  4. Controlled substance agreement signed - ALPRAZolam (XANAX) 1 MG  tablet; Take 1 tablet (1 mg total) by mouth at bedtime.  Dispense: 30 tablet; Refill: 3  5. Benzodiazepine dependence ([place])   Health Maintenance reviewed Diet and exercise encouraged  Follow up plan: 4 months    Evelina Dun, FNP

## 2021-01-06 NOTE — Patient Instructions (Signed)

## 2021-01-19 DIAGNOSIS — H02145 Spastic ectropion of left lower eyelid: Secondary | ICD-10-CM | POA: Diagnosis not present

## 2021-01-19 DIAGNOSIS — H02142 Spastic ectropion of right lower eyelid: Secondary | ICD-10-CM | POA: Diagnosis not present

## 2021-01-19 DIAGNOSIS — H02135 Senile ectropion of left lower eyelid: Secondary | ICD-10-CM | POA: Diagnosis not present

## 2021-01-19 DIAGNOSIS — H16213 Exposure keratoconjunctivitis, bilateral: Secondary | ICD-10-CM | POA: Diagnosis not present

## 2021-01-19 DIAGNOSIS — H02132 Senile ectropion of right lower eyelid: Secondary | ICD-10-CM | POA: Diagnosis not present

## 2021-01-20 DIAGNOSIS — H4901 Third [oculomotor] nerve palsy, right eye: Secondary | ICD-10-CM | POA: Diagnosis not present

## 2021-01-20 DIAGNOSIS — H168 Other keratitis: Secondary | ICD-10-CM | POA: Diagnosis not present

## 2021-01-20 DIAGNOSIS — H16231 Neurotrophic keratoconjunctivitis, right eye: Secondary | ICD-10-CM | POA: Diagnosis not present

## 2021-01-20 DIAGNOSIS — H538 Other visual disturbances: Secondary | ICD-10-CM | POA: Diagnosis not present

## 2021-01-25 ENCOUNTER — Other Ambulatory Visit: Payer: Self-pay | Admitting: Neurological Surgery

## 2021-01-25 ENCOUNTER — Other Ambulatory Visit (HOSPITAL_COMMUNITY): Payer: Self-pay | Admitting: Neurological Surgery

## 2021-01-25 DIAGNOSIS — D329 Benign neoplasm of meninges, unspecified: Secondary | ICD-10-CM

## 2021-02-10 ENCOUNTER — Other Ambulatory Visit: Payer: Self-pay

## 2021-02-10 ENCOUNTER — Ambulatory Visit (HOSPITAL_COMMUNITY)
Admission: RE | Admit: 2021-02-10 | Discharge: 2021-02-10 | Disposition: A | Discharge status: Home / Self Care | Payer: BC Managed Care – PPO | Source: Ambulatory Visit | Attending: Neurological Surgery | Admitting: Neurological Surgery

## 2021-02-10 DIAGNOSIS — G9389 Other specified disorders of brain: Secondary | ICD-10-CM | POA: Diagnosis not present

## 2021-02-10 DIAGNOSIS — D329 Benign neoplasm of meninges, unspecified: Secondary | ICD-10-CM | POA: Insufficient documentation

## 2021-02-10 MED ORDER — GADOBUTROL 1 MMOL/ML IV SOLN
8.0000 mL | Freq: Once | INTRAVENOUS | Status: AC | PRN
  Administered 2021-02-10: 8 mL via INTRAVENOUS

## 2021-02-24 DIAGNOSIS — D329 Benign neoplasm of meninges, unspecified: Secondary | ICD-10-CM | POA: Diagnosis not present

## 2021-02-24 DIAGNOSIS — Z6825 Body mass index (BMI) 25.0-25.9, adult: Secondary | ICD-10-CM | POA: Diagnosis not present

## 2021-02-24 DIAGNOSIS — R030 Elevated blood-pressure reading, without diagnosis of hypertension: Secondary | ICD-10-CM | POA: Diagnosis not present

## 2021-04-03 DIAGNOSIS — J329 Chronic sinusitis, unspecified: Secondary | ICD-10-CM | POA: Diagnosis not present

## 2021-04-03 DIAGNOSIS — H04121 Dry eye syndrome of right lacrimal gland: Secondary | ICD-10-CM | POA: Diagnosis not present

## 2021-04-03 DIAGNOSIS — H16211 Exposure keratoconjunctivitis, right eye: Secondary | ICD-10-CM | POA: Diagnosis not present

## 2021-04-03 DIAGNOSIS — H16231 Neurotrophic keratoconjunctivitis, right eye: Secondary | ICD-10-CM | POA: Diagnosis not present

## 2021-04-21 DIAGNOSIS — H168 Other keratitis: Secondary | ICD-10-CM | POA: Diagnosis not present

## 2021-04-21 DIAGNOSIS — H4720 Unspecified optic atrophy: Secondary | ICD-10-CM | POA: Diagnosis not present

## 2021-04-21 DIAGNOSIS — H16231 Neurotrophic keratoconjunctivitis, right eye: Secondary | ICD-10-CM | POA: Diagnosis not present

## 2021-05-12 ENCOUNTER — Other Ambulatory Visit: Payer: Self-pay

## 2021-05-12 ENCOUNTER — Ambulatory Visit: Payer: BC Managed Care – PPO | Admitting: Family

## 2021-05-12 ENCOUNTER — Encounter: Payer: Self-pay | Admitting: Family

## 2021-05-12 VITALS — BP 130/77 | HR 61 | Temp 98.0°F | Resp 20 | Ht 73.0 in | Wt 190.0 lb

## 2021-05-12 DIAGNOSIS — Z0000 Encounter for general adult medical examination without abnormal findings: Secondary | ICD-10-CM | POA: Diagnosis not present

## 2021-05-12 DIAGNOSIS — W57XXXA Bitten or stung by nonvenomous insect and other nonvenomous arthropods, initial encounter: Secondary | ICD-10-CM

## 2021-05-12 DIAGNOSIS — Z79899 Other long term (current) drug therapy: Secondary | ICD-10-CM

## 2021-05-12 DIAGNOSIS — Z0001 Encounter for general adult medical examination with abnormal findings: Secondary | ICD-10-CM | POA: Diagnosis not present

## 2021-05-12 DIAGNOSIS — J329 Chronic sinusitis, unspecified: Secondary | ICD-10-CM

## 2021-05-12 DIAGNOSIS — S50861A Insect bite (nonvenomous) of right forearm, initial encounter: Secondary | ICD-10-CM

## 2021-05-12 DIAGNOSIS — F5101 Primary insomnia: Secondary | ICD-10-CM | POA: Diagnosis not present

## 2021-05-12 DIAGNOSIS — D329 Benign neoplasm of meninges, unspecified: Secondary | ICD-10-CM

## 2021-05-12 DIAGNOSIS — F1320 Sedative, hypnotic or anxiolytic dependence, uncomplicated: Secondary | ICD-10-CM

## 2021-05-12 DIAGNOSIS — H65191 Other acute nonsuppurative otitis media, right ear: Secondary | ICD-10-CM

## 2021-05-12 MED ORDER — DOXYCYCLINE HYCLATE 100 MG PO TABS
100.0000 mg | ORAL_TABLET | Freq: Two times a day (BID) | ORAL | 0 refills | Status: DC
Start: 2021-05-12 — End: 2021-08-25

## 2021-05-12 MED ORDER — CIPROFLOXACIN-DEXAMETHASONE 0.3-0.1 % OT SUSP: 4.0000 [drp] | Freq: Two times a day (BID) | OTIC | 0 refills | Status: DC

## 2021-05-12 MED ORDER — ALPRAZOLAM 1 MG PO TABS: 1.0000 mg | ORAL_TABLET | Freq: Every day | ORAL | 2 refills | Status: DC

## 2021-05-12 NOTE — Patient Instructions (Signed)
Health Maintenance, Male Adopting a healthy lifestyle and getting preventive care are important in promoting health and wellness. Ask your health care provider about: The right schedule for you to have regular tests and exams. Things you can do on your own to prevent diseases and keep yourself healthy. What should I know about diet, weight, and exercise? Eat a healthy diet  Eat a diet that includes plenty of vegetables, fruits, low-fat dairy products, and lean protein. Do not eat a lot of foods that are high in solid fats, added sugars, or sodium. Maintain a healthy weight Body mass index (BMI) is a measurement that can be used to identify possible weight problems. It estimates body fat based on height and weight. Your health care provider can help determine your BMI and help you achieve or maintain a healthy weight. Get regular exercise Get regular exercise. This is one of the most important things you can do for your health. Most adults should: Exercise for at least 150 minutes each week. The exercise should increase your heart rate and make you sweat (moderate-intensity exercise). Do strengthening exercises at least twice a week. This is in addition to the moderate-intensity exercise. Spend less time sitting. Even light physical activity can be beneficial. Watch cholesterol and blood lipids Have your blood tested for lipids and cholesterol at 66 years of age, then have this test every 5 years. You may need to have your cholesterol levels checked more often if: Your lipid or cholesterol levels are high. You are older than 66 years of age. You are at high risk for heart disease. What should I know about cancer screening? Many types of cancers can be detected early and may often be prevented. Depending on your health history and family history, you may need to have cancer screening at various ages. This may include screening for: Colorectal cancer. Prostate cancer. Skin cancer. Lung  cancer. What should I know about heart disease, diabetes, and high blood pressure? Blood pressure and heart disease High blood pressure causes heart disease and increases the risk of stroke. This is more likely to develop in people who have high blood pressure readings, are of African descent, or are overweight. Talk with your health care provider about your target blood pressure readings. Have your blood pressure checked: Every 3-5 years if you are 18-39 years of age. Every year if you are 40 years old or older. If you are between the ages of 65 and 75 and are a current or former smoker, ask your health care provider if you should have a one-time screening for abdominal aortic aneurysm (AAA). Diabetes Have regular diabetes screenings. This checks your fasting blood sugar level. Have the screening done: Once every three years after age 45 if you are at a normal weight and have a low risk for diabetes. More often and at a younger age if you are overweight or have a high risk for diabetes. What should I know about preventing infection? Hepatitis B If you have a higher risk for hepatitis B, you should be screened for this virus. Talk with your health care provider to find out if you are at risk for hepatitis B infection. Hepatitis C Blood testing is recommended for: Everyone born from 1945 through 1965. Anyone with known risk factors for hepatitis C. Sexually transmitted infections (STIs) You should be screened each year for STIs, including gonorrhea and chlamydia, if: You are sexually active and are younger than 66 years of age. You are older than 66 years   of age and your health care provider tells you that you are at risk for this type of infection. Your sexual activity has changed since you were last screened, and you are at increased risk for chlamydia or gonorrhea. Ask your health care provider if you are at risk. Ask your health care provider about whether you are at high risk for HIV.  Your health care provider may recommend a prescription medicine to help prevent HIV infection. If you choose to take medicine to prevent HIV, you should first get tested for HIV. You should then be tested every 3 months for as long as you are taking the medicine. Follow these instructions at home: Lifestyle Do not use any products that contain nicotine or tobacco, such as cigarettes, e-cigarettes, and chewing tobacco. If you need help quitting, ask your health care provider. Do not use street drugs. Do not share needles. Ask your health care provider for help if you need support or information about quitting drugs. Alcohol use Do not drink alcohol if your health care provider tells you not to drink. If you drink alcohol: Limit how much you have to 0-2 drinks a day. Be aware of how much alcohol is in your drink. In the [place] one drink equals one 12 oz bottle of beer (355 mL), one 5 oz glass of wine (148 mL), or one 1 oz glass of hard liquor (44 mL). General instructions Schedule regular health, dental, and eye exams. Stay current with your vaccines. Tell your health care provider if: You often feel depressed. You have ever been abused or do not feel safe at home. Summary Adopting a healthy lifestyle and getting preventive care are important in promoting health and wellness. Follow your health care provider's instructions about healthy diet, exercising, and getting tested or screened for diseases. Follow your health care provider's instructions on monitoring your cholesterol and blood pressure. This information is not intended to replace advice given to you by your health care provider. Make sure you discuss any questions you have with your health care provider. Document Revised: 10/21/2020 Document Reviewed: 08/06/2018 Elsevier Patient Education  2022 [employer].  

## 2021-05-12 NOTE — Progress Notes (Signed)
Subjective:    Patient ID: Frank Hayden, male    DOB: [birth year], 65 y.o.   MRN: [id number]  Chief Complaint  Patient presents with   Medical Management of Chronic Issues   Pt presents to the office today for CPE and chronic follow up. He had a meningioma partially removed in 02/1999 that involved is right sinus cavity, optic  nerve, and carotid artery. He completed radiation. He is followed by a Neurosurgeon annually to continue to monitor and has another meningioma.  He takes 1 mg Xanax to help with nerve pain on side of face.    He is followed by Dermatologists every 6 months. He is followed by Ophthalmologist every 4 months.   He is also requesting to be tested for lyme disease. He had a small tick bite on right forearm that he removed two months ago. He states the area was red for a week. He also had several tick bites on her lower legs this summer.  Sinusitis This is a chronic problem. The current episode started more than 1 month ago. The problem has been waxing and waning since onset. There has been no fever. His pain is at a severity of 7/10. Associated symptoms include congestion, coughing ("every now an then"), ear pain (right ear drainage), headaches, sinus pressure and sneezing. Pertinent negatives include no hoarse voice, shortness of breath, sore throat or swollen glands. Past treatments include spray decongestants, saline sprays and oral decongestants (xyzal and benadryl). The treatment provided mild relief.  Insomnia Primary symptoms: difficulty falling asleep, frequent awakening.   The current episode started more than one year. The onset quality is gradual. The problem occurs intermittently.     Review of Systems  HENT:  Positive for congestion, ear pain (right ear drainage), sinus pressure and sneezing. Negative for hoarse voice and sore throat.   Respiratory:  Positive for cough ("every now an then"). Negative for shortness of breath.   Neurological:  Positive for  headaches.  Psychiatric/Behavioral:  The patient has insomnia.   All other systems reviewed and are negative.  Family History  Problem Relation Age of Onset   Heart disease Mother        Heart enlarged and mechanical valve surgery.   Hypertension Mother    Heart disease Father        5 bypass   Hypertension Father    Social History   Socioeconomic History   Marital status: Married    Spouse name: Not on file   Number of children: Not on file   Years of education: Not on file   Highest education level: Not on file  Occupational History   Not on file  Tobacco Use   Smoking status: Former   Smokeless tobacco: Never   Tobacco comments:    quit 40 years ago  Vaping Use   Vaping Use: Never used  Substance and Sexual Activity   Alcohol use: No   Drug use: No   Sexual activity: Not on file  Other Topics Concern   Not on file  Social History Narrative   Not on file   Social Determinants of Health   Financial Resource Strain: Not on file  Food Insecurity: Not on file  Transportation Needs: Not on file  Physical Activity: Not on file  Stress: Not on file  Social Connections: Not on file       Objective:   Physical Exam Vitals reviewed.  Constitutional:      General: He is not  in acute distress.    Appearance: He is well-developed.  HENT:     Head: Normocephalic.     Right Ear: Drainage present. Tympanic membrane is perforated and erythematous.     Left Ear: Tympanic membrane normal.  Eyes:     General:        Right eye: No discharge.        Left eye: No discharge.     Pupils: Pupils are equal, round, and reactive to light.  Neck:     Thyroid: No thyromegaly.  Cardiovascular:     Rate and Rhythm: Normal rate and regular rhythm.     Heart sounds: Normal heart sounds. No murmur heard. Pulmonary:     Effort: Pulmonary effort is normal. No respiratory distress.     Breath sounds: Normal breath sounds. No wheezing.  Abdominal:     General: Bowel sounds are  normal. There is no distension.     Palpations: Abdomen is soft.     Tenderness: There is no abdominal tenderness.  Musculoskeletal:        General: No tenderness. Normal range of motion.     Cervical back: Normal range of motion and neck supple.  Skin:    General: Skin is warm and dry.     Findings: No erythema or rash.  Neurological:     Mental Status: He is alert and oriented to person, place, and time.     Cranial Nerves: No cranial nerve deficit.     Deep Tendon Reflexes: Reflexes are normal and symmetric.  Psychiatric:        Behavior: Behavior normal.        Thought Content: Thought content normal.        Judgment: Judgment normal.      BP 130/77   Pulse 61   Temp 98 F (36.7 C) (Temporal)   Resp 20   Ht $R'6\' 1"'pQ$  (1.854 m)   Wt 190 lb (86.2 kg)   SpO2 99%   BMI 25.07 kg/m      Assessment & Plan:  ZEDEKIAH HINDERMAN comes in today with chief complaint of Medical Management of Chronic Issues   Diagnosis and orders addressed:  1. Chronic sinusitis, unspecified location - CMP14+EGFR - CBC with Differential/Platelet - doxycycline (VIBRA-TABS) 100 MG tablet; Take 1 tablet (100 mg total) by mouth 2 (two) times daily.  Dispense: 20 tablet; Refill: 0  2. Meningioma ([place])  - CMP14+EGFR - CBC with Differential/Platelet  3. Primary insomnia - ALPRAZolam (XANAX) 1 MG tablet; Take 1 tablet (1 mg total) by mouth at bedtime.  Dispense: 30 tablet; Refill: 2 - CMP14+EGFR - CBC with Differential/Platelet  4. Controlled substance agreement signed - ALPRAZolam (XANAX) 1 MG tablet; Take 1 tablet (1 mg total) by mouth at bedtime.  Dispense: 30 tablet; Refill: 2 - CMP14+EGFR - CBC with Differential/Platelet - ToxASSURE Select 13 (MW), Urine  5. Benzodiazepine dependence ([facility]) - CMP14+EGFR - CBC with Differential/Platelet - ToxASSURE Select 13 (MW), Urine  6. Tick bite of right forearm, initial encounter  - CMP14+EGFR - CBC with Differential/Platelet - Lyme Disease  Serology w/Reflex  7. Annual physical exam - CMP14+EGFR - CBC with Differential/Platelet - Lipid panel - PSA, total and free - Thyroid Panel With TSH - Lyme Disease Serology w/Reflex - ToxASSURE Select 13 (MW), Urine  8. Other acute nonsuppurative otitis media of right ear, recurrence not specified - ciprofloxacin-dexamethasone (CIPRODEX) OTIC suspension; Place 4 drops into the right ear 2 (two) times daily.  Dispense: 7.5  mL; Refill: 0   Labs pending Patient reviewed in [place] controlled database, no flags noted. Contract and drug screen up dated today Health Maintenance reviewed Diet and exercise encouraged  Follow up plan: 3 months    Evelina Dun, FNP

## 2021-05-15 ENCOUNTER — Other Ambulatory Visit: Payer: Self-pay | Admitting: Family

## 2021-05-15 LAB — CMP14+EGFR
ALT: 14 IU/L (ref 0–44)
AST: 17 IU/L (ref 0–40)
Albumin/Globulin Ratio: 2.5 — ABNORMAL HIGH (ref 1.2–2.2)
Albumin: 4.7 g/dL (ref 3.8–4.8)
Alkaline Phosphatase: 71 IU/L (ref 44–121)
BUN/Creatinine Ratio: 10 (ref 10–24)
BUN: 9 mg/dL (ref 8–27)
Bilirubin Total: 0.6 mg/dL (ref 0.0–1.2)
CO2: 24 mmol/L (ref 20–29)
Calcium: 9.7 mg/dL (ref 8.6–10.2)
Chloride: 103 mmol/L (ref 96–106)
Creatinine, Ser: 0.91 mg/dL (ref 0.76–1.27)
Globulin, Total: 1.9 g/dL (ref 1.5–4.5)
Glucose: 94 mg/dL (ref 65–99)
Potassium: 4.2 mmol/L (ref 3.5–5.2)
Sodium: 143 mmol/L (ref 134–144)
Total Protein: 6.6 g/dL (ref 6.0–8.5)
eGFR: 93 mL/min/{1.73_m2} (ref >59)

## 2021-05-15 LAB — THYROID PANEL WITH TSH
Free Thyroxine Index: 2 (ref 1.2–4.9)
T3 Uptake Ratio: 25 % (ref 24–39)
T4, Total: 8 ug/dL (ref 4.5–12.0)
TSH: 0.827 u[IU]/mL (ref 0.450–4.500)

## 2021-05-15 LAB — CBC WITH DIFFERENTIAL/PLATELET
Basophils Absolute: 0 10*3/uL (ref 0.0–0.2)
Basos: 1 %
EOS (ABSOLUTE): 0.3 10*3/uL (ref 0.0–0.4)
Eos: 5 %
Hematocrit: 47.2 % (ref 37.5–51.0)
Hemoglobin: 15.7 g/dL (ref 13.0–17.7)
Immature Grans (Abs): 0 10*3/uL (ref 0.0–0.1)
Immature Granulocytes: 0 %
Lymphocytes Absolute: 1 10*3/uL (ref 0.7–3.1)
Lymphs: 16 %
MCH: 28.2 pg (ref 26.6–33.0)
MCHC: 33.3 g/dL (ref 31.5–35.7)
MCV: 85 fL (ref 79–97)
Monocytes Absolute: 0.5 10*3/uL (ref 0.1–0.9)
Monocytes: 8 %
Neutrophils Absolute: 4.4 10*3/uL (ref 1.4–7.0)
Neutrophils: 70 %
Platelets: 191 10*3/uL (ref 150–450)
RBC: 5.56 x10E6/uL (ref 4.14–5.80)
RDW: 13.5 % (ref 11.6–15.4)
WBC: 6.2 10*3/uL (ref 3.4–10.8)

## 2021-05-15 LAB — LIPID PANEL
Chol/HDL Ratio: 8.1 ratio — ABNORMAL HIGH (ref 0.0–5.0)
Cholesterol, Total: 317 mg/dL — ABNORMAL HIGH (ref 100–199)
HDL: 39 mg/dL — ABNORMAL LOW (ref >39)
LDL Chol Calc (NIH): 230 mg/dL — ABNORMAL HIGH (ref 0–99)
Triglycerides: 236 mg/dL — ABNORMAL HIGH (ref 0–149)
VLDL Cholesterol Cal: 48 mg/dL — ABNORMAL HIGH (ref 5–40)

## 2021-05-15 LAB — PSA, TOTAL AND FREE
PSA, Free Pct: 42.7 %
PSA, Free: 0.47 ng/mL
Prostate Specific Ag, Serum: 1.1 ng/mL (ref 0.0–4.0)

## 2021-05-15 LAB — LYME DISEASE SEROLOGY W/REFLEX: Lyme Total Antibody EIA: NEGATIVE

## 2021-05-17 LAB — TOXASSURE SELECT 13 (MW), URINE

## 2021-05-19 DIAGNOSIS — L989 Disorder of the skin and subcutaneous tissue, unspecified: Secondary | ICD-10-CM | POA: Diagnosis not present

## 2021-05-19 DIAGNOSIS — L570 Actinic keratosis: Secondary | ICD-10-CM | POA: Diagnosis not present

## 2021-05-19 DIAGNOSIS — L814 Other melanin hyperpigmentation: Secondary | ICD-10-CM | POA: Diagnosis not present

## 2021-05-19 DIAGNOSIS — D225 Melanocytic nevi of trunk: Secondary | ICD-10-CM | POA: Diagnosis not present

## 2021-05-19 DIAGNOSIS — C44329 Squamous cell carcinoma of skin of other parts of face: Secondary | ICD-10-CM | POA: Diagnosis not present

## 2021-05-19 DIAGNOSIS — L821 Other seborrheic keratosis: Secondary | ICD-10-CM | POA: Diagnosis not present

## 2021-05-19 DIAGNOSIS — D485 Neoplasm of uncertain behavior of skin: Secondary | ICD-10-CM | POA: Diagnosis not present

## 2021-06-22 DIAGNOSIS — H16231 Neurotrophic keratoconjunctivitis, right eye: Secondary | ICD-10-CM | POA: Diagnosis not present

## 2021-06-30 DIAGNOSIS — Z481 Encounter for planned postprocedural wound closure: Secondary | ICD-10-CM | POA: Diagnosis not present

## 2021-06-30 DIAGNOSIS — C4492 Squamous cell carcinoma of skin, unspecified: Secondary | ICD-10-CM | POA: Diagnosis not present

## 2021-06-30 DIAGNOSIS — C44329 Squamous cell carcinoma of skin of other parts of face: Secondary | ICD-10-CM | POA: Diagnosis not present

## 2021-07-05 DIAGNOSIS — H04121 Dry eye syndrome of right lacrimal gland: Secondary | ICD-10-CM | POA: Diagnosis not present

## 2021-07-05 DIAGNOSIS — H16231 Neurotrophic keratoconjunctivitis, right eye: Secondary | ICD-10-CM | POA: Diagnosis not present

## 2021-07-05 DIAGNOSIS — H02411 Mechanical ptosis of right eyelid: Secondary | ICD-10-CM | POA: Diagnosis not present

## 2021-07-05 DIAGNOSIS — Z961 Presence of intraocular lens: Secondary | ICD-10-CM | POA: Diagnosis not present

## 2021-07-27 ENCOUNTER — Ambulatory Visit (HOSPITAL_BASED_OUTPATIENT_CLINIC_OR_DEPARTMENT_OTHER)
Admission: RE | Admit: 2021-07-27 | Discharge: 2021-07-27 | Disposition: A | Discharge status: Home / Self Care | Payer: BC Managed Care – PPO | Source: Ambulatory Visit | Attending: Nurse Practitioner | Admitting: Nurse Practitioner

## 2021-07-27 ENCOUNTER — Ambulatory Visit: Payer: BC Managed Care – PPO | Admitting: Nurse Practitioner

## 2021-07-27 ENCOUNTER — Other Ambulatory Visit: Payer: Self-pay

## 2021-07-27 ENCOUNTER — Encounter: Payer: Self-pay | Admitting: Nurse Practitioner

## 2021-07-27 VITALS — BP 138/74 | HR 64 | Temp 97.1°F | Resp 20 | Ht 73.0 in | Wt 188.0 lb

## 2021-07-27 DIAGNOSIS — S0990XA Unspecified injury of head, initial encounter: Secondary | ICD-10-CM | POA: Diagnosis not present

## 2021-07-27 NOTE — Progress Notes (Signed)
   Subjective:    Patient ID: Frank Hayden, male    DOB: 12-05-1955, 65 y.o.   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 He fell backwards and hit his head on wooden ramp. He has a headache with nausea.    Review of Systems  Eyes:  Negative for visual disturbance.  Gastrointestinal:  Positive for nausea.  Neurological:  Positive for dizziness and headaches.  All other systems reviewed and are negative.     Objective:   Physical Exam Vitals and nursing note reviewed.  Constitutional:      Appearance: Normal appearance.  Cardiovascular:     Rate and Rhythm: Normal rate.     Heart sounds: Normal heart sounds.  Pulmonary:     Effort: Pulmonary effort is normal.     Breath sounds: Normal breath sounds.  Skin:    General: Skin is warm.  Neurological:     General: No focal deficit present.     Mental Status: He is alert and oriented to person, place, and time.  Psychiatric:        Mood and Affect: Mood normal.        Behavior: Behavior normal.     Comments: Answers all questions appropriately   BP 138/74   Pulse 64   Temp (!) 97.1 F (36.2 C) (Temporal)   Resp 20   Ht 6\' 1"  (1.854 m)   Wt 188 lb (85.3 kg)   SpO2 100%   BMI 24.80 kg/m          Assessment & Plan:  Renato Shin in today with chief complaint of No chief complaint on file.   1. Injury of head, initial encounter Stat head CT- will talk after we get results - CT HEAD WO CONTRAST (5MM); Future    The above assessment and management plan was discussed with the patient. The patient verbalized understanding of and has agreed to the management plan. Patient is aware to call the clinic if symptoms persist or worsen. Patient is aware when to return to the clinic for a follow-up visit. Patient educated on when it is appropriate to go to the emergency department.   Mary-Margaret Hassell Done, FNP

## 2021-07-27 NOTE — Patient Instructions (Signed)
Head Injury, Adult There are many types of head injuries. Head injuries can be as minor as a small bump, or they can be a serious medical issue. More severe head injuries include: A jarring injury to the brain (concussion). A bruise (contusion) of the brain. This means there is bleeding in the brain that can cause swelling. A cracked skull (skull fracture). Bleeding in the brain that collects, clots, and forms a bump (hematoma). After a head injury, most problems occur within the first 24 hours, but side effects may occur up to 7-10 days after the injury. It is important to watch your condition for any changes. You may need to be observed in the emergency department or urgent care, or you may be admitted to the hospital. What are the causes? There are many possible causes of a head injury. Serious head injuries may be caused by car accidents, bicycle or motorcycle accidents, sports injuries, falls, or being struck by an object. What are the symptoms? Symptoms of a head injury include a contusion, bump, or bleeding at the site of the injury. Other physical symptoms may include: Headache. Nausea or vomiting. Dizziness. Blurred or double vision. Being uncomfortable around bright lights or loud noises. Seizures. Feeling tired. Trouble being awakened. Loss of consciousness. Mental or emotional symptoms may include: Irritability. Confusion and memory problems. Poor attention and concentration. Changes in eating or sleeping habits. Anxiety or depression. How is this diagnosed? This condition can usually be diagnosed based on your symptoms, a description of the injury, and a physical exam. You may also have imaging tests done, such as a CT scan or an MRI. How is this treated? Treatment for this condition depends on the severity and type of injury you have. The main goal of treatment is to prevent complications and allow the brain time to heal. Mild head injury If you have a mild head injury,  you may be sent home, and treatment may include: Observation. A responsible adult should stay with you for 24 hours after your injury and check on you often. Physical rest. Brain rest. Pain medicines. Severe head injury If you have a severe head injury, treatment may include: Close observation. This includes hospitalization with the following care: Frequent physical exams. Frequent checks of how your brain and nervous system are working (neurological status). Checking your blood pressure and oxygen levels. Medicines to relieve pain, prevent seizures, and decrease brain swelling. Airway protection and breathing support. This may include using a ventilator. Treatments that monitor and manage swelling inside the brain. Brain surgery. This may be needed to: Remove a collection of blood or blood clots. Stop the bleeding. Remove a part of the skull to allow room for the brain to swell. Follow these instructions at home: Activity Rest and avoid activities that are physically hard or tiring. Make sure you get enough sleep. Let your brain rest by limiting activities that require a lot of thought or attention, such as: Watching TV. Playing memory games and puzzles. Job-related work or homework. Working on the computer, using social media, and texting. Avoid activities that could cause another head injury, such as playing sports, until your health care provider approves. Having another head injury, especially before the first one has healed, can be dangerous. Ask your health care provider when it is safe for you to return to your regular activities, including work or school. Ask your health care provider for a step-by-step plan for gradually returning to activities. Ask your health care provider when you can drive,   ride a bicycle, or use heavy machinery. Your ability to react may be slower after a brain injury. Do not do these activities if you are dizzy. Lifestyle  Do not drink alcohol until  your health care provider approves. Do not use drugs. Alcohol and certain drugs may slow your recovery and can put you at risk of further injury. If it is harder than usual to remember things, write them down. If you are easily distracted, try to do one thing at a time. Talk with family members or close friends when making important decisions. Tell your friends, family, a trusted colleague, and work Freight forwarder about your injury, symptoms, and restrictions. Have them watch for any new or worsening problems. General instructions Take over-the-counter and prescription medicines only as told by your health care provider. Have someone stay with you for 24 hours after your head injury. This person should watch you for any changes in your symptoms and be ready to seek medical help. Keep all follow-up visits as told by your health care provider. This is important. How is this prevented? Work on improving your balance and strength to avoid falls. Wear a seat belt when you are in a moving vehicle. Wear a helmet when riding a bicycle, skiing, or doing any other sport or activity that has a risk of injury. If you drink alcohol: Limit how much you use to: 0-1 drink a day for nonpregnant women. 0-2 drinks a day for men. Be aware of how much alcohol is in your drink. In the [place] one drink equals one 12 oz bottle of beer (355 mL), one 5 oz glass of wine (148 mL), or one 1 oz glass of hard liquor (44 mL). Take safety measures in your home, such as: Removing clutter and tripping hazards from floors and stairways. Using grab bars in bathrooms and handrails by stairs. Placing non-slip mats on floors and in bathtubs. Improving lighting in dim areas. Where to find more information Centers for Disease Control and Prevention: http://www.wolf.info/ Get help right away if: You have: A severe headache that is not helped by medicine. Trouble walking or weakness in your arms and legs. Clear or bloody fluid coming from your  nose or ears. Changes in your vision. A seizure. Increased confusion or irritability. Your symptoms get worse. You are sleepier than normal and have trouble staying awake. You lose your balance. Your pupils change size. Your speech is slurred. Your dizziness gets worse. You vomit. These symptoms may represent a serious problem that is an emergency. Do not wait to see if the symptoms will go away. Get medical help right away. Call your local emergency services (911 in the [place] Do not drive yourself to the hospital. Summary Head injuries can be minor, or they can be a serious medical issue requiring immediate attention. Treatment for this condition depends on the severity and type of injury you have. Have someone stay with you for 24 hours after your injury and check on you often. Ask your health care provider when it is safe for you to return to your regular activities, including work or school. Head injury prevention includes wearing a seat belt in a motor vehicle, using a helmet on a bicycle, limiting alcohol use, and taking safety measures in your home. This information is not intended to replace advice given to you by your health care provider. Make sure you discuss any questions you have with your health care provider. Document Revised: 06/26/2019 Document Reviewed: 06/26/2019 Elsevier Patient Education  2022  [employer].

## 2021-07-28 DIAGNOSIS — H538 Other visual disturbances: Secondary | ICD-10-CM | POA: Diagnosis not present

## 2021-07-28 DIAGNOSIS — H4901 Third [oculomotor] nerve palsy, right eye: Secondary | ICD-10-CM | POA: Diagnosis not present

## 2021-07-28 DIAGNOSIS — Z961 Presence of intraocular lens: Secondary | ICD-10-CM | POA: Diagnosis not present

## 2021-07-28 DIAGNOSIS — H16231 Neurotrophic keratoconjunctivitis, right eye: Secondary | ICD-10-CM | POA: Diagnosis not present

## 2021-08-08 DIAGNOSIS — L570 Actinic keratosis: Secondary | ICD-10-CM | POA: Diagnosis not present

## 2021-08-08 DIAGNOSIS — L578 Other skin changes due to chronic exposure to nonionizing radiation: Secondary | ICD-10-CM | POA: Diagnosis not present

## 2021-08-16 ENCOUNTER — Other Ambulatory Visit: Payer: Self-pay | Admitting: Family

## 2021-08-16 DIAGNOSIS — Z79899 Other long term (current) drug therapy: Secondary | ICD-10-CM

## 2021-08-16 DIAGNOSIS — F5101 Primary insomnia: Secondary | ICD-10-CM

## 2021-08-25 ENCOUNTER — Encounter: Payer: Self-pay | Admitting: Family

## 2021-08-25 ENCOUNTER — Ambulatory Visit: Payer: BC Managed Care – PPO | Admitting: Family

## 2021-08-25 VITALS — BP 140/78 | HR 61 | Temp 97.9°F | Ht 73.0 in | Wt 187.8 lb

## 2021-08-25 DIAGNOSIS — Z79899 Other long term (current) drug therapy: Secondary | ICD-10-CM | POA: Diagnosis not present

## 2021-08-25 DIAGNOSIS — F5101 Primary insomnia: Secondary | ICD-10-CM | POA: Diagnosis not present

## 2021-08-25 DIAGNOSIS — J329 Chronic sinusitis, unspecified: Secondary | ICD-10-CM | POA: Diagnosis not present

## 2021-08-25 DIAGNOSIS — D329 Benign neoplasm of meninges, unspecified: Secondary | ICD-10-CM

## 2021-08-25 DIAGNOSIS — F1320 Sedative, hypnotic or anxiolytic dependence, uncomplicated: Secondary | ICD-10-CM

## 2021-08-25 MED ORDER — ALPRAZOLAM 1 MG PO TABS: 1.0000 mg | ORAL_TABLET | Freq: Every day | ORAL | 2 refills | Status: DC

## 2021-08-25 NOTE — Patient Instructions (Signed)
Health Maintenance After Age 65 After age 65, you are at a higher risk for certain long-term diseases and infections as well as injuries from falls. Falls are a major cause of broken bones and head injuries in people who are older than age 65. Getting regular preventive care can help to keep you healthy and well. Preventive care includes getting regular testing and making lifestyle changes as recommended by your health care provider. Talk with your health care provider about: Which screenings and tests you should have. A screening is a test that checks for a disease when you have no symptoms. A diet and exercise plan that is right for you. What should I know about screenings and tests to prevent falls? Screening and testing are the best ways to find a health problem early. Early diagnosis and treatment give you the best chance of managing medical conditions that are common after age 65. Certain conditions and lifestyle choices may make you more likely to have a fall. Your health care provider may recommend: Regular vision checks. Poor vision and conditions such as cataracts can make you more likely to have a fall. If you wear glasses, make sure to get your prescription updated if your vision changes. Medicine review. Work with your health care provider to regularly review all of the medicines you are taking, including over-the-counter medicines. Ask your health care provider about any side effects that may make you more likely to have a fall. Tell your health care provider if any medicines that you take make you feel dizzy or sleepy. Strength and balance checks. Your health care provider may recommend certain tests to check your strength and balance while standing, walking, or changing positions. Foot health exam. Foot pain and numbness, as well as not wearing proper footwear, can make you more likely to have a fall. Screenings, including: Osteoporosis screening. Osteoporosis is a condition that causes  the bones to get weaker and break more easily. Blood pressure screening. Blood pressure changes and medicines to control blood pressure can make you feel dizzy. Depression screening. You may be more likely to have a fall if you have a fear of falling, feel depressed, or feel unable to do activities that you used to do. Alcohol use screening. Using too much alcohol can affect your balance and may make you more likely to have a fall. Follow these instructions at home: Lifestyle Do not drink alcohol if: Your health care provider tells you not to drink. If you drink alcohol: Limit how much you have to: 0-1 drink a day for women. 0-2 drinks a day for men. Know how much alcohol is in your drink. In the [place] one drink equals one 12 oz bottle of beer (355 mL), one 5 oz glass of wine (148 mL), or one 1 oz glass of hard liquor (44 mL). Do not use any products that contain nicotine or tobacco. These products include cigarettes, chewing tobacco, and vaping devices, such as e-cigarettes. If you need help quitting, ask your health care provider. Activity  Follow a regular exercise program to stay fit. This will help you maintain your balance. Ask your health care provider what types of exercise are appropriate for you. If you need a cane or walker, use it as recommended by your health care provider. Wear supportive shoes that have nonskid soles. Safety  Remove any tripping hazards, such as rugs, cords, and clutter. Install safety equipment such as grab bars in bathrooms and safety rails on stairs. Keep rooms and walkways   well-lit. General instructions Talk with your health care provider about your risks for falling. Tell your health care provider if: You fall. Be sure to tell your health care provider about all falls, even ones that seem minor. You feel dizzy, tiredness (fatigue), or off-balance. Take over-the-counter and prescription medicines only as told by your health care provider. These include  supplements. Eat a healthy diet and maintain a healthy weight. A healthy diet includes low-fat dairy products, low-fat (lean) meats, and fiber from whole grains, beans, and lots of fruits and vegetables. Stay current with your vaccines. Schedule regular health, dental, and eye exams. Summary Having a healthy lifestyle and getting preventive care can help to protect your health and wellness after age 65. Screening and testing are the best way to find a health problem early and help you avoid having a fall. Early diagnosis and treatment give you the best chance for managing medical conditions that are more common for people who are older than age 65. Falls are a major cause of broken bones and head injuries in people who are older than age 65. Take precautions to prevent a fall at home. Work with your health care provider to learn what changes you can make to improve your health and wellness and to prevent falls. This information is not intended to replace advice given to you by your health care provider. Make sure you discuss any questions you have with your health care provider. Document Revised: 01/02/2021 Document Reviewed: 01/02/2021 Elsevier Patient Education  2022 [employer]  

## 2021-08-25 NOTE — Progress Notes (Signed)
Subjective:    Patient ID: Frank Hayden, male    DOB: Mar 22, 1956, 65 y.o.   MRN: [id number]  Chief Complaint  Patient presents with   Medical Management of Chronic Issues   Pt presents to the office today for  chronic follow up. He had a meningioma partially removed in 02/1999 that involved is right sinus cavity, optic nerve, and carotid artery. He completed radiation. He is followed by a Neurosurgeon annually to continue to monitor and has another meningioma.  He takes 1 mg Xanax to help with nerve pain on side of face.    He is followed by Dermatologists every 6 months. He is followed by Ophthalmologist every 4 months.  Sinus Problem This is a chronic problem. The current episode started more than 1 year ago. The problem has been waxing and waning since onset. Associated symptoms include headaches and sinus pressure. Pertinent negatives include no coughing or ear pain. Past treatments include oral decongestants. The treatment provided mild relief.  Insomnia Primary symptoms: difficulty falling asleep, frequent awakening.   The current episode started more than one year. The onset quality is gradual. The problem occurs intermittently.     Review of Systems  HENT:  Positive for sinus pressure. Negative for ear pain.   Respiratory:  Negative for cough.   Neurological:  Positive for headaches.  Psychiatric/Behavioral:  The patient has insomnia.   All other systems reviewed and are negative.     Objective:   Physical Exam Vitals reviewed.  Constitutional:      General: He is not in acute distress.    Appearance: He is well-developed.  HENT:     Head: Normocephalic.     Right Ear: Tympanic membrane normal.     Left Ear: Tympanic membrane normal.  Eyes:     General:        Right eye: No discharge.        Left eye: No discharge.     Pupils: Pupils are equal, round, and reactive to light.  Neck:     Thyroid: No thyromegaly.  Cardiovascular:     Rate and Rhythm: Normal rate  and regular rhythm.     Heart sounds: Normal heart sounds. No murmur heard. Pulmonary:     Effort: Pulmonary effort is normal. No respiratory distress.     Breath sounds: Normal breath sounds. No wheezing.  Abdominal:     General: Bowel sounds are normal. There is no distension.     Palpations: Abdomen is soft.     Tenderness: There is no abdominal tenderness.  Musculoskeletal:        General: No tenderness. Normal range of motion.     Cervical back: Normal range of motion and neck supple.  Skin:    General: Skin is warm and dry.     Findings: No erythema or rash.  Neurological:     Mental Status: He is alert and oriented to person, place, and time.     Cranial Nerves: No cranial nerve deficit.     Deep Tendon Reflexes: Reflexes are normal and symmetric.  Psychiatric:        Behavior: Behavior normal.        Thought Content: Thought content normal.        Judgment: Judgment normal.         BP 140/78    Pulse 61    Temp 97.9 F (36.6 C) (Temporal)    Ht 6\' 1"  (1.854 m)    Wt 187  lb 12.8 oz (85.2 kg)    BMI 24.78 kg/m   Assessment & Plan:  Frank Hayden comes in today with chief complaint of Medical Management of Chronic Issues   Diagnosis and orders addressed:  1. Primary insomnia - ALPRAZolam (XANAX) 1 MG tablet; Take 1 tablet (1 mg total) by mouth at bedtime.  Dispense: 30 tablet; Refill: 2  2. Controlled substance agreement signed - ALPRAZolam (XANAX) 1 MG tablet; Take 1 tablet (1 mg total) by mouth at bedtime.  Dispense: 30 tablet; Refill: 2  3. Chronic sinusitis, unspecified location  4. Meningioma ([place])  5. 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 Benzodiazepine dependence ([place])   Patient reviewed in [place] controlled database, no flags noted. Contract and drug screen are up to date.  Health Maintenance reviewed Diet and exercise encouraged  Follow up plan: 3 months    Frank Dun, FNP

## 2021-08-31 DIAGNOSIS — L244 Irritant contact dermatitis due to drugs in contact with skin: Secondary | ICD-10-CM | POA: Diagnosis not present

## 2021-08-31 DIAGNOSIS — L570 Actinic keratosis: Secondary | ICD-10-CM | POA: Diagnosis not present

## 2021-09-04 DIAGNOSIS — H16211 Exposure keratoconjunctivitis, right eye: Secondary | ICD-10-CM | POA: Diagnosis not present

## 2021-09-04 DIAGNOSIS — H04121 Dry eye syndrome of right lacrimal gland: Secondary | ICD-10-CM | POA: Diagnosis not present

## 2021-09-04 DIAGNOSIS — H16231 Neurotrophic keratoconjunctivitis, right eye: Secondary | ICD-10-CM | POA: Diagnosis not present

## 2021-09-04 DIAGNOSIS — H02411 Mechanical ptosis of right eyelid: Secondary | ICD-10-CM | POA: Diagnosis not present

## 2021-09-20 ENCOUNTER — Other Ambulatory Visit (HOSPITAL_COMMUNITY): Payer: Self-pay | Admitting: Neurological Surgery

## 2021-09-20 ENCOUNTER — Other Ambulatory Visit: Payer: Self-pay | Admitting: Neurological Surgery

## 2021-09-20 DIAGNOSIS — D329 Benign neoplasm of meninges, unspecified: Secondary | ICD-10-CM

## 2021-10-06 ENCOUNTER — Ambulatory Visit (HOSPITAL_COMMUNITY)
Admission: RE | Admit: 2021-10-06 | Discharge: 2021-10-06 | Disposition: A | Discharge status: Home / Self Care | Payer: BC Managed Care – PPO | Source: Ambulatory Visit | Attending: Neurological Surgery | Admitting: Neurological Surgery

## 2021-10-06 ENCOUNTER — Other Ambulatory Visit: Payer: Self-pay

## 2021-10-06 DIAGNOSIS — D329 Benign neoplasm of meninges, unspecified: Secondary | ICD-10-CM | POA: Diagnosis not present

## 2021-10-06 DIAGNOSIS — G9389 Other specified disorders of brain: Secondary | ICD-10-CM | POA: Diagnosis not present

## 2021-10-06 MED ORDER — GADOBUTROL 1 MMOL/ML IV SOLN
7.5000 mL | Freq: Once | INTRAVENOUS | Status: AC | PRN
  Administered 2021-10-06: 7.5 mL via INTRAVENOUS

## 2021-10-20 DIAGNOSIS — D329 Benign neoplasm of meninges, unspecified: Secondary | ICD-10-CM | POA: Diagnosis not present

## 2021-11-24 ENCOUNTER — Ambulatory Visit: Payer: BC Managed Care – PPO | Admitting: Family

## 2021-11-24 ENCOUNTER — Encounter: Payer: Self-pay | Admitting: Family

## 2021-11-24 VITALS — BP 127/83 | HR 66 | Temp 97.2°F | Ht 73.0 in | Wt 189.6 lb

## 2021-11-24 DIAGNOSIS — Z23 Encounter for immunization: Secondary | ICD-10-CM | POA: Diagnosis not present

## 2021-11-24 DIAGNOSIS — F5101 Primary insomnia: Secondary | ICD-10-CM

## 2021-11-24 DIAGNOSIS — F1320 Sedative, hypnotic or anxiolytic dependence, uncomplicated: Secondary | ICD-10-CM

## 2021-11-24 DIAGNOSIS — J329 Chronic sinusitis, unspecified: Secondary | ICD-10-CM

## 2021-11-24 DIAGNOSIS — Z79899 Other long term (current) drug therapy: Secondary | ICD-10-CM | POA: Diagnosis not present

## 2021-11-24 DIAGNOSIS — D329 Benign neoplasm of meninges, unspecified: Secondary | ICD-10-CM

## 2021-11-24 MED ORDER — AMOXICILLIN 875 MG PO TABS: 875.0000 mg | ORAL_TABLET | Freq: Two times a day (BID) | ORAL | 0 refills | Status: AC

## 2021-11-24 MED ORDER — ALPRAZOLAM 1 MG PO TABS: 1.0000 mg | ORAL_TABLET | Freq: Every day | ORAL | 2 refills | Status: DC

## 2021-11-24 NOTE — Patient Instructions (Signed)

## 2021-11-24 NOTE — Progress Notes (Signed)
? ?Subjective:  ? ? Patient ID: Frank Hayden, male    DOB: [birth year], 66 y.o.   MRN: [id number] ? ?Chief Complaint  ?Patient presents with  ? Medical Management of Chronic Issues  ?  Sinus   ? ?Pt presents to the office today for  chronic follow up. He had a meningioma partially removed in 02/1999 that involved is right sinus cavity, optic nerve, and carotid artery. He completed radiation. He is followed by a Neurosurgeon annually to continue to monitor and has another meningioma.  He takes 1 mg Xanax to help with nerve pain on side of face.  ?  ?He is followed by Dermatologists every 6 months. He is followed by Ophthalmologist every 4 months. ?Sinusitis ?This is a chronic problem. The current episode started more than 1 year ago. The problem has been waxing and waning since onset. There has been no fever. His pain is at a severity of 8/10. The pain is moderate. Associated symptoms include congestion, ear pain, headaches, a hoarse voice, sinus pressure, sneezing and a sore throat. Pertinent negatives include no coughing. Past treatments include oral decongestants and acetaminophen. The treatment provided mild relief.  ?Insomnia ?Primary symptoms: difficulty falling asleep, frequent awakening.   ?The current episode started more than one year. The onset quality is gradual. The problem occurs intermittently.  ?Rash ?This is a new problem. The current episode started more than 1 month ago. The problem has been waxing and waning since onset. The affected locations include the chest and abdomen (back). The rash is characterized by redness. It is unknown if there was an exposure to a precipitant. Associated symptoms include congestion and a sore throat. Pertinent negatives include no cough. Past treatments include nothing. The treatment provided no relief.  ? ? ? ?Review of Systems  ?HENT:  Positive for congestion, ear pain, hoarse voice, sinus pressure, sneezing and sore throat.   ?Respiratory:  Negative for cough.    ?Skin:  Positive for rash.  ?Neurological:  Positive for headaches.  ?Psychiatric/Behavioral:  The patient has insomnia.   ?All other systems reviewed and are negative. ? ?   ?Objective:  ? Physical Exam ?Vitals reviewed.  ?Constitutional:   ?   General: He is not in acute distress. ?   Appearance: He is well-developed.  ?HENT:  ?   Head: Normocephalic.  ?   Right Ear: Tympanic membrane normal.  ?   Left Ear: Tympanic membrane normal.  ?   Nose:  ?   Right Sinus: Maxillary sinus tenderness present.  ?Eyes:  ?   General:     ?   Right eye: No discharge.     ?   Left eye: No discharge.  ?   Pupils: Pupils are equal, round, and reactive to light.  ?Neck:  ?   Thyroid: No thyromegaly.  ?Cardiovascular:  ?   Rate and Rhythm: Normal rate and regular rhythm.  ?   Heart sounds: Normal heart sounds. No murmur heard. ?Pulmonary:  ?   Effort: Pulmonary effort is normal. No respiratory distress.  ?   Breath sounds: Normal breath sounds. No wheezing.  ?Abdominal:  ?   General: Bowel sounds are normal. There is no distension.  ?   Palpations: Abdomen is soft.  ?   Tenderness: There is no abdominal tenderness.  ?Musculoskeletal:     ?   General: No tenderness. Normal range of motion.  ?   Cervical back: Normal range of motion and neck supple.  ?Skin: ?  General: Skin is warm and dry.  ?   Findings: No erythema or rash.  ?Neurological:  ?   Mental Status: He is alert and oriented to person, place, and time.  ?   Cranial Nerves: No cranial nerve deficit.  ?   Deep Tendon Reflexes: Reflexes are normal and symmetric.  ?Psychiatric:     ?   Behavior: Behavior normal.     ?   Thought Content: Thought content normal.     ?   Judgment: Judgment normal.  ? ? ?BP 127/83   Pulse 66   Temp (!) 97.2 ?F (36.2 ?C) (Temporal)   Ht '6\' 1"'$  (1.854 m)   Wt 189 lb 9.6 oz (86 kg)   BMI 25.01 kg/m?  ? ? ? ?   ?Assessment & Plan:  ?ELAN BRAINERD comes in today with chief complaint of Medical Management of Chronic Issues (Sinus ) ? ? ?Diagnosis  and orders addressed: ? ?1. Primary insomnia ?- ALPRAZolam (XANAX) 1 MG tablet; Take 1 tablet (1 mg total) by mouth at bedtime.  Dispense: 30 tablet; Refill: 2 ? ?2. Controlled substance agreement signed ?- ALPRAZolam (XANAX) 1 MG tablet; Take 1 tablet (1 mg total) by mouth at bedtime.  Dispense: 30 tablet; Refill: 2 ? ?3. Need for pneumococcal vaccination ?- Pneumococcal conjugate vaccine 20-valent (Prevnar 20) ? ?4. Chronic sinusitis, unspecified location ?- Take meds as prescribed ?- Use a cool mist humidifier  ?-Use saline nose sprays frequently ?-Force fluids ?-For any cough or congestion ? Use plain Mucinex- regular strength or max strength is fine ?-For fever or aces or pains- take tylenol or ibuprofen. ?-Throat lozenges if help ?- amoxicillin (AMOXIL) 875 MG tablet; Take 1 tablet (875 mg total) by mouth 2 (two) times daily for 7 days.  Dispense: 14 tablet; Refill: 0 ? ?5. Meningioma of right sphenoid wing involving cavernous sinus (HCC) ? ?6. Benzodiazepine dependence ([place]) ? ? ?Labs pending ?Patient reviewed in [place] controlled database, no flags noted. Contract and drug screen are up to date on 05/17/21 ?Health Maintenance reviewed ?Diet and exercise encouraged ? ?Follow up plan: ?3 months  ? ?Evelina Dun, FNP ? ? ? ?

## 2022-01-04 DIAGNOSIS — L111 Transient acantholytic dermatosis [Grover]: Secondary | ICD-10-CM | POA: Diagnosis not present

## 2022-01-04 DIAGNOSIS — L298 Other pruritus: Secondary | ICD-10-CM | POA: Diagnosis not present

## 2022-01-04 DIAGNOSIS — Z09 Encounter for follow-up examination after completed treatment for conditions other than malignant neoplasm: Secondary | ICD-10-CM | POA: Diagnosis not present

## 2022-01-04 DIAGNOSIS — L570 Actinic keratosis: Secondary | ICD-10-CM | POA: Diagnosis not present

## 2022-01-10 DIAGNOSIS — Z961 Presence of intraocular lens: Secondary | ICD-10-CM | POA: Diagnosis not present

## 2022-01-10 DIAGNOSIS — H04121 Dry eye syndrome of right lacrimal gland: Secondary | ICD-10-CM | POA: Diagnosis not present

## 2022-01-10 DIAGNOSIS — H16211 Exposure keratoconjunctivitis, right eye: Secondary | ICD-10-CM | POA: Diagnosis not present

## 2022-01-10 DIAGNOSIS — H16231 Neurotrophic keratoconjunctivitis, right eye: Secondary | ICD-10-CM | POA: Diagnosis not present

## 2022-01-26 DIAGNOSIS — H4720 Unspecified optic atrophy: Secondary | ICD-10-CM | POA: Diagnosis not present

## 2022-01-26 DIAGNOSIS — Z961 Presence of intraocular lens: Secondary | ICD-10-CM | POA: Diagnosis not present

## 2022-01-26 DIAGNOSIS — H16231 Neurotrophic keratoconjunctivitis, right eye: Secondary | ICD-10-CM | POA: Diagnosis not present

## 2022-01-26 DIAGNOSIS — H168 Other keratitis: Secondary | ICD-10-CM | POA: Diagnosis not present

## 2022-02-06 ENCOUNTER — Encounter: Payer: Self-pay | Admitting: Family Medicine

## 2022-02-06 ENCOUNTER — Ambulatory Visit: Payer: BC Managed Care – PPO | Admitting: Family Medicine

## 2022-02-06 VITALS — BP 128/76 | HR 64 | Temp 98.4°F | Ht 73.0 in | Wt 188.0 lb

## 2022-02-06 DIAGNOSIS — H6123 Impacted cerumen, bilateral: Secondary | ICD-10-CM

## 2022-02-06 DIAGNOSIS — J0140 Acute pansinusitis, unspecified: Secondary | ICD-10-CM

## 2022-02-06 MED ORDER — FLUTICASONE PROPIONATE 50 MCG/ACT NA SUSP: NASAL | 2 refills | Status: DC

## 2022-02-06 MED ORDER — DOXYCYCLINE HYCLATE 100 MG PO TABS: 100.0000 mg | ORAL_TABLET | Freq: Two times a day (BID) | ORAL | 0 refills | Status: AC

## 2022-02-06 NOTE — Progress Notes (Signed)
Subjective:  Patient ID: Frank Hayden, male    DOB: 05-22-1956, 66 y.o.   MRN: [id number]  Patient Care Team: Sharion Balloon, FNP as PCP - General (Family Medicine)   Chief Complaint:  Sinus Problem (Balance issue/recurring/Recently X 2 weeks)   HPI: Frank Hayden is a 65 y.o. male presenting on 02/06/2022 for Sinus Problem (Balance issue/recurring/Recently X 2 weeks)   Sinus Problem This is a new problem. The current episode started 1 to 4 weeks ago. The problem has been gradually worsening since onset. His pain is at a severity of 5/10. The pain is moderate. Associated symptoms include chills, congestion, ear pain, headaches, sinus pressure and swollen glands. Pertinent negatives include no coughing, diaphoresis, hoarse voice, neck pain, shortness of breath, sneezing or sore throat. Past treatments include acetaminophen and oral decongestants. The treatment provided no relief.  He does complaint of feeling off-balance or dizzy at times with the symptoms. No reported focal neurological deficits. Symptoms ongoing for over 2 weeks.   Relevant past medical, surgical, family, and social history reviewed and updated as indicated.  Allergies and medications reviewed and updated. Data reviewed: Chart in Epic.   Past Medical History:  Diagnosis Date   Anxiety    Has headaches associated with past surgery.   Cancer ([place])    Brain    Hearing loss    decreased 95% right / decreased 24% left ear    Past Surgical History:  Procedure Laterality Date   APPENDECTOMY     EYE SURGERY Right    Tumor removed behind eye.   TONSILLECTOMY      Social History   Socioeconomic History   Marital status: Married    Spouse name: Not on file   Number of children: Not on file   Years of education: Not on file   Highest education level: Not on file  Occupational History   Not on file  Tobacco Use   Smoking status: Former   Smokeless tobacco: Never   Tobacco comments:    quit 40  years ago  Vaping Use   Vaping Use: Never used  Substance and Sexual Activity   Alcohol use: No   Drug use: No   Sexual activity: Not on file  Other Topics Concern   Not on file  Social History Narrative   Not on file   Social Determinants of Health   Financial Resource Strain: Not on file  Food Insecurity: Not on file  Transportation Needs: Not on file  Physical Activity: Not on file  Stress: Not on file  Social Connections: Not on file  Intimate Partner Violence: Not on file    Outpatient Encounter Medications as of 02/06/2022  Medication Sig   acetaminophen (TYLENOL) 500 MG tablet Take 1 tablet (500 mg total) by mouth every 6 (six) hours as needed.   ALPRAZolam (XANAX) 1 MG tablet Take 1 tablet (1 mg total) by mouth at bedtime.   Bepotastine Besilate 1.5 % SOLN    Cenegermin-bkbj (OXERVATE) 0.002 % SOLN Apply to eye.   Cholecalciferol (VITAMIN D) 2000 units CAPS Take by mouth.   cycloSPORINE (RESTASIS) 0.05 % ophthalmic emulsion 1 drop 2 (two) times daily.   doxycycline (VIBRA-TABS) 100 MG tablet Take 1 tablet (100 mg total) by mouth 2 (two) times daily for 10 days. 1 po bid   fluorometholone (FML) 0.1 % ophthalmic suspension    levocetirizine (XYZAL) 5 MG tablet Take 5 mg by mouth every evening.   naproxen  sodium (ALEVE) 220 MG tablet Take by mouth.   Potassium 99 MG TABS Take by mouth.   UNABLE TO FIND Med Name: fluorouracil cream   vitamin B-12 (CYANOCOBALAMIN) 1000 MCG tablet Take 1,000 mcg by mouth daily.   vitamin E 400 UNIT capsule Take 400 Units by mouth daily.   Zinc Sulfate (ZINC 15 PO) Take by mouth.   [DISCONTINUED] fluticasone (FLONASE) 50 MCG/ACT nasal spray SPRAY 1 SPRAY IN EACH NOSTRIL ONCE DAILY.   fluticasone (FLONASE) 50 MCG/ACT nasal spray SPRAY 1 SPRAY IN EACH NOSTRIL ONCE DAILY.   triamcinolone cream (KENALOG) 0.1 % Apply topically 2 (two) times daily as needed.   TYRVAYA 0.03 MG/ACT SOLN Place 1 spray into both nostrils 2 (two) times daily.   No  facility-administered encounter medications on file as of 02/06/2022.    Allergies  Allergen Reactions   Atorvastatin Other (See Comments)    Muscle aches   Carbamazepine Nausea And Vomiting    Chest tightness Chest tightness    Pregabalin Nausea And Vomiting   Augmentin [Amoxicillin-Pot Clavulanate] Diarrhea   Latex Dermatitis    Review of Systems  Constitutional:  Positive for activity change, appetite change, chills and fatigue. Negative for diaphoresis, fever and unexpected weight change.  HENT:  Positive for congestion, ear pain, postnasal drip, sinus pressure and sinus pain. Negative for dental problem, drooling, ear discharge, facial swelling, hearing loss, hoarse voice, mouth sores, rhinorrhea, sneezing, sore throat, tinnitus, trouble swallowing and voice change.   Respiratory:  Negative for cough and shortness of breath.   Cardiovascular:  Negative for chest pain, palpitations and leg swelling.  Gastrointestinal:  Negative for abdominal pain, diarrhea, nausea and vomiting.  Genitourinary:  Negative for decreased urine volume and difficulty urinating.  Musculoskeletal:  Negative for arthralgias, myalgias and neck pain.  Neurological:  Positive for dizziness (off-balance feeling) and headaches. Negative for tremors, seizures, syncope, facial asymmetry, speech difficulty, weakness, light-headedness and numbness.  Psychiatric/Behavioral:  Negative for confusion.   All other systems reviewed and are negative.       Objective:  BP 128/76   Pulse 64   Temp 98.4 F (36.9 C)   Ht _0  (1.854 m)   Wt 188 lb (85.3 kg)   SpO2 99%   BMI 24.80 kg/m    Wt Readings from Last 3 Encounters:  02/06/22 188 lb (85.3 kg)  11/24/21 189 lb 9.6 oz (86 kg)  08/25/21 187 lb 12.8 oz (85.2 kg)    Physical Exam Vitals and nursing note reviewed.  Constitutional:      General: He is not in acute distress.    Appearance: Normal appearance. He is normal weight. He is not ill-appearing,  toxic-appearing or diaphoretic.  HENT:     Head: Atraumatic.     Jaw: There is normal jaw occlusion.     Comments: Right temporal indention from prior surgery    Right Ear: There is impacted cerumen.     Left Ear: There is impacted cerumen.     Nose: Congestion and rhinorrhea present.     Right Turbinates: Swollen.     Left Turbinates: Swollen.     Right Sinus: Maxillary sinus tenderness and frontal sinus tenderness present.     Left Sinus: Maxillary sinus tenderness and frontal sinus tenderness present.     Mouth/Throat:     Lips: Pink.     Mouth: Mucous membranes are moist.     Pharynx: Posterior oropharyngeal erythema present. No oropharyngeal exudate.     Tonsils: No  tonsillar exudate or tonsillar abscesses.     Comments: Postnasal drainage Eyes:     Pupils: Pupils are equal, round, and reactive to light.     Comments: Right lid ptosis - chronic  Cardiovascular:     Rate and Rhythm: Normal rate and regular rhythm.     Heart sounds: Normal heart sounds.  Pulmonary:     Effort: Pulmonary effort is normal.     Breath sounds: Normal breath sounds.  Musculoskeletal:     Right lower leg: No edema.     Left lower leg: No edema.  Skin:    General: Skin is warm and dry.     Capillary Refill: Capillary refill takes less than 2 seconds.  Neurological:     General: No focal deficit present.     Mental Status: He is alert and oriented to person, place, and time.  Psychiatric:        Mood and Affect: Mood normal.        Behavior: Behavior normal.        Thought Content: Thought content normal.        Judgment: Judgment normal.     Results for orders placed or performed in visit on 05/12/21  CMP14+EGFR  Result Value Ref Range   Glucose 94 65 - 99 mg/dL   BUN 9 8 - 27 mg/dL   Creatinine, Ser 0.91 0.76 - 1.27 mg/dL   eGFR 93 >59 mL/min/1.73   BUN/Creatinine Ratio 10 10 - 24   Sodium 143 134 - 144 mmol/L   Potassium 4.2 3.5 - 5.2 mmol/L   Chloride 103 96 - 106 mmol/L   CO2 24  20 - 29 mmol/L   Calcium 9.7 8.6 - 10.2 mg/dL   Total Protein 6.6 6.0 - 8.5 g/dL   Albumin 4.7 3.8 - 4.8 g/dL   Globulin, Total 1.9 1.5 - 4.5 g/dL   Albumin/Globulin Ratio 2.5 (H) 1.2 - 2.2   Bilirubin Total 0.6 0.0 - 1.2 mg/dL   Alkaline Phosphatase 71 44 - 121 IU/L   AST 17 0 - 40 IU/L   ALT 14 0 - 44 IU/L  CBC with Differential/Platelet  Result Value Ref Range   WBC 6.2 3.4 - 10.8 x10E3/uL   RBC 5.56 4.14 - 5.80 x10E6/uL   Hemoglobin 15.7 13.0 - 17.7 g/dL   Hematocrit 47.2 37.5 - 51.0 %   MCV 85 79 - 97 fL   MCH 28.2 26.6 - 33.0 pg   MCHC 33.3 31.5 - 35.7 g/dL   RDW 13.5 11.6 - 15.4 %   Platelets 191 150 - 450 x10E3/uL   Neutrophils 70 Not Estab. %   Lymphs 16 Not Estab. %   Monocytes 8 Not Estab. %   Eos 5 Not Estab. %   Basos 1 Not Estab. %   Neutrophils Absolute 4.4 1.4 - 7.0 x10E3/uL   Lymphocytes Absolute 1.0 0.7 - 3.1 x10E3/uL   Monocytes Absolute 0.5 0.1 - 0.9 x10E3/uL   EOS (ABSOLUTE) 0.3 0.0 - 0.4 x10E3/uL   Basophils Absolute 0.0 0.0 - 0.2 x10E3/uL   Immature Granulocytes 0 Not Estab. %   Immature Grans (Abs) 0.0 0.0 - 0.1 x10E3/uL  Lipid panel  Result Value Ref Range   Cholesterol, Total 317 (H) 100 - 199 mg/dL   Triglycerides 236 (H) 0 - 149 mg/dL   HDL 39 (L) >39 mg/dL   VLDL Cholesterol Cal 48 (H) 5 - 40 mg/dL   LDL Chol Calc (NIH) 230 (H) 0 -  99 mg/dL   Lipid Comment: Comment    Chol/HDL Ratio 8.1 (H) 0.0 - 5.0 ratio  PSA, total and free  Result Value Ref Range   Prostate Specific Ag, Serum 1.1 0.0 - 4.0 ng/mL   PSA, Free 0.47 N/A ng/mL   PSA, Free Pct 42.7 %  Thyroid Panel With TSH  Result Value Ref Range   TSH 0.827 0.450 - 4.500 uIU/mL   T4, Total 8.0 4.5 - 12.0 ug/dL   T3 Uptake Ratio 25 24 - 39 %   Free Thyroxine Index 2.0 1.2 - 4.9  Lyme Disease Serology w/Reflex  Result Value Ref Range   Lyme Total Antibody EIA Negative Negative  ToxASSURE Select 13 (MW), Urine  Result Value Ref Range   Summary Note      Ear Cerumen  Removal  Date/Time: 02/06/2022 9:25 AM  Performed by: Baruch Gouty, FNP Authorized by: Baruch Gouty, FNP   Anesthesia: Local Anesthetic: none Location details: right ear Patient tolerance: patient tolerated the procedure well with no immediate complications Comments: TM normal post cerumen removal Procedure type: curette (and irrigation)  Sedation: Patient sedated: no    Ear Cerumen Removal  Date/Time: 02/06/2022 9:25 AM  Performed by: Baruch Gouty, FNP Authorized by: Baruch Gouty, FNP   Anesthesia: Local Anesthetic: none Location details: left ear Patient tolerance: patient tolerated the procedure well with no immediate complications Comments: TM normal post cerumen removal Procedure type: irrigation  Sedation: Patient sedated: no       Pertinent labs & imaging results that were available during my care of the patient were reviewed by me and considered in my medical decision making.  Assessment & Plan:  Frank Hayden was seen today for sinus problem.  Diagnoses and all orders for this visit:  Acute non-recurrent pansinusitis Due to ongoing and worsening symptoms, will start antibiotic therapy. Continue flonase and mucinex. Increase water intake. Report any new, worsening, or persistent symptoms.  -     fluticasone (FLONASE) 50 MCG/ACT nasal spray; SPRAY 1 SPRAY IN EACH NOSTRIL ONCE DAILY. -     doxycycline (VIBRA-TABS) 100 MG tablet; Take 1 tablet (100 mg total) by mouth 2 (two) times daily for 10 days. 1 po bid  Bilateral impacted cerumen Tolerated well. Debrox use at home discussed.  -     Ear Cerumen Removal -     Ear Cerumen Removal     Continue all other maintenance medications.  Follow up plan: Return if symptoms worsen or fail to improve.   Continue healthy lifestyle choices, including diet (rich in fruits, vegetables, and lean proteins, and low in salt and simple carbohydrates) and exercise (at least 30 minutes of moderate physical activity  daily).  Educational handout given for sinus infection  The above assessment and management plan was discussed with the patient. The patient verbalized understanding of and has agreed to the management plan. Patient is aware to call the clinic if they develop any new symptoms or if symptoms persist or worsen. Patient is aware when to return to the clinic for a follow-up visit. Patient educated on when it is appropriate to go to the emergency department.   Monia Pouch, FNP-C [facility] [phone]

## 2022-02-20 ENCOUNTER — Other Ambulatory Visit: Payer: Self-pay | Admitting: Family

## 2022-02-20 DIAGNOSIS — F5101 Primary insomnia: Secondary | ICD-10-CM

## 2022-02-20 DIAGNOSIS — Z79899 Other long term (current) drug therapy: Secondary | ICD-10-CM

## 2022-03-02 ENCOUNTER — Ambulatory Visit: Payer: BC Managed Care – PPO | Admitting: Family

## 2022-03-02 ENCOUNTER — Encounter: Payer: Self-pay | Admitting: Family

## 2022-03-02 VITALS — BP 138/75 | HR 65 | Temp 97.7°F | Ht 73.0 in | Wt 185.2 lb

## 2022-03-02 DIAGNOSIS — S30860A Insect bite (nonvenomous) of lower back and pelvis, initial encounter: Secondary | ICD-10-CM

## 2022-03-02 DIAGNOSIS — F1320 Sedative, hypnotic or anxiolytic dependence, uncomplicated: Secondary | ICD-10-CM | POA: Diagnosis not present

## 2022-03-02 DIAGNOSIS — W57XXXA Bitten or stung by nonvenomous insect and other nonvenomous arthropods, initial encounter: Secondary | ICD-10-CM

## 2022-03-02 DIAGNOSIS — J329 Chronic sinusitis, unspecified: Secondary | ICD-10-CM

## 2022-03-02 DIAGNOSIS — Z79899 Other long term (current) drug therapy: Secondary | ICD-10-CM

## 2022-03-02 DIAGNOSIS — F5101 Primary insomnia: Secondary | ICD-10-CM | POA: Diagnosis not present

## 2022-03-02 DIAGNOSIS — D329 Benign neoplasm of meninges, unspecified: Secondary | ICD-10-CM

## 2022-03-02 MED ORDER — TRIAMCINOLONE ACETONIDE 0.5 % EX OINT: 1.0000 | TOPICAL_OINTMENT | Freq: Two times a day (BID) | CUTANEOUS | 0 refills | Status: DC

## 2022-03-02 MED ORDER — ALPRAZOLAM 1 MG PO TABS: 1.0000 mg | ORAL_TABLET | Freq: Every day | ORAL | 2 refills | Status: DC

## 2022-03-02 NOTE — Progress Notes (Signed)
Subjective:    Patient ID: Frank Hayden, male    DOB: Feb 08, 1956, 65 y.o.   MRN: [id number]  Chief Complaint  Patient presents with   Medical Management of Chronic Issues   Pt presents to the office today for  chronic follow up. He had a meningioma partially removed in 02/1999 that involved is right sinus cavity, optic nerve, and carotid artery. He completed radiation. He is followed by a Neurosurgeon annually to continue to monitor and has another meningioma.  He takes 1 mg Xanax to help with nerve pain on side of face.    He is followed by Dermatologists every 6 months. He is followed by Ophthalmologist every 4 months. Insomnia Primary symptoms: difficulty falling asleep, frequent awakening.   The current episode started more than one year. The onset quality is gradual. The problem occurs intermittently. The problem has been waxing and waning since onset.  Sinusitis This is a chronic problem. The current episode started more than 1 year ago. The problem has been waxing and waning since onset.  Rash This is a new problem. The current episode started 1 to 4 weeks ago. The problem has been waxing and waning since onset. Location: back. The rash is characterized by redness. He was exposed to an insect bite/sting.      Review of Systems  Skin:  Positive for rash.  Psychiatric/Behavioral:  The patient has insomnia.   All other systems reviewed and are negative.      Objective:   Physical Exam Vitals reviewed.  Constitutional:      General: He is not in acute distress.    Appearance: He is well-developed.  HENT:     Head: Normocephalic.     Right Ear: Tympanic membrane normal.     Left Ear: Tympanic membrane normal.  Eyes:     General:        Right eye: No discharge.        Left eye: No discharge.     Pupils: Pupils are equal, round, and reactive to light.  Neck:     Thyroid: No thyromegaly.  Cardiovascular:     Rate and Rhythm: Normal rate and regular rhythm.      Heart sounds: Normal heart sounds. No murmur heard. Pulmonary:     Effort: Pulmonary effort is normal. No respiratory distress.     Breath sounds: Normal breath sounds. No wheezing.  Abdominal:     General: Bowel sounds are normal. There is no distension.     Palpations: Abdomen is soft.     Tenderness: There is no abdominal tenderness.  Musculoskeletal:        General: No tenderness. Normal range of motion.     Cervical back: Normal range of motion and neck supple.  Skin:    General: Skin is warm and dry.     Findings: No erythema or rash.          Comments: Erythemas 0.5X0.5cm   Neurological:     Mental Status: He is alert and oriented to person, place, and time.     Cranial Nerves: No cranial nerve deficit.     Deep Tendon Reflexes: Reflexes are normal and symmetric.  Psychiatric:        Behavior: Behavior normal.        Thought Content: Thought content normal.        Judgment: Judgment normal.        BP 138/75   Pulse 65   Temp 97.7 F (36.5  C)   Ht '6\' 1"'$  (1.854 m)   Wt 185 lb 3.2 oz (84 kg)   SpO2 99%   BMI 24.43 kg/m   Assessment & Plan:  STANFORD STRAUCH comes in today with chief complaint of Medical Management of Chronic Issues   Diagnosis and orders addressed:  1. Primary insomnia - ALPRAZolam (XANAX) 1 MG tablet; Take 1 tablet (1 mg total) by mouth at bedtime.  Dispense: 30 tablet; Refill: 2  2. Controlled substance agreement signed - ALPRAZolam (XANAX) 1 MG tablet; Take 1 tablet (1 mg total) by mouth at bedtime.  Dispense: 30 tablet; Refill: 2  3. Benzodiazepine dependence ([place])  4. Chronic sinusitis, unspecified location  5. Meningioma ([place])  6. Insect bite of lower back, initial encounter - triamcinolone ointment (KENALOG) 0.5 %; Apply 1 Application topically 2 (two) times daily.  Dispense: 30 g; Refill: 0   Labs pending Patient reviewed in [place] controlled database, no flags noted. Contract and drug screen are up to date.  Health Maintenance  reviewed Diet and exercise encouraged  Follow up plan: 3 months   Evelina Dun, FNP

## 2022-03-02 NOTE — Patient Instructions (Signed)
Insect Bite, Adult An insect bite can make your skin red, itchy, and swollen. An insect bite is different from an insect sting, which happens when an insect injects poison (venom) into the skin. Some insects can spread disease to people through a bite. However, most insect bites do not lead to disease and are not serious. What are the causes? Insects may bite for a variety of reasons, including: Hunger. To defend themselves. Insects that bite include: Spiders. Mosquitoes. Ticks. Fleas. Ants. Flies. Kissing bugs. Chiggers. What are the signs or symptoms? Symptoms of this condition include: Itching or pain in the bite area. Redness and swelling in the bite area. An open wound (skin ulcer). In many cases, symptoms last for 2-4 days. In rare cases, a person may have a severe allergic reaction (anaphylactic reaction) to a bite. Symptoms of an anaphylactic reaction may include: Feeling warm in the face (flushed). This may include redness. Itchy, red, swollen areas of skin (hives). Swelling of the eyes, lips, face, mouth, tongue, or throat. Difficulty breathing, speaking, or swallowing. Noisy breathing (wheezing). Dizziness or light-headedness. Fainting. Pain or cramping in the abdomen. Vomiting. Diarrhea. How is this diagnosed? This condition is usually diagnosed based on symptoms and a physical exam. How is this treated? Treatment is usually not needed. Symptoms often go away on their own. When treatment is recommended, it may involve: Applying a cream or lotion to the bite area. This treatment helps with itching. Taking an antibiotic medicine. This treatment is needed if the bite area gets infected. Getting a tetanus shot, if you are not up to date on this vaccine. Applying ice to the affected area. Allergy medicines called antihistamines. This treatment may be needed if you develop itching or an allergic reaction to the insect bite. Giving yourself an epinephrine injection if  you have an anaphylactic reaction to a bite. To give the injection, you will use what is commonly called an auto-injector "pen" (pre-filled automatic epinephrine injection device). Your health care provider will teach you how to use an auto-injector pen. Follow these instructions at home: Bite area care  Do not scratch the bite area. Keep the bite area clean and dry. Wash it every day with soap and water as told by your health care provider. Check the bite area every day for signs of infection. Check for: Redness, swelling, or pain. Fluid or blood. Warmth. Pus or a bad smell. Managing pain, itching, and swelling  You may apply cortisone cream, calamine lotion, or a paste made of baking soda and water to the bite area as told by your health care provider. If directed, put ice on the bite area. Put ice in a plastic bag. Place a towel between your skin and the bag. Leave the ice on for 20 minutes, 2-3 times a day. General instructions Apply or take over-the-counter and prescription medicines only as told by your health care provider. If you were prescribed an antibiotic medicine, take or apply it as told by your health care provider. Do not stop using the antibiotic even if your condition improves. Keep all follow-up visits as told by your health care provider. This is important. How is this prevented? To help reduce your risk of insect bites: When you are outdoors, wear clothing that covers your arms and legs. This is especially important in the early morning and evening. Use insect repellent. The best insect repellents contain DEET, picaridin, oil of lemon eucalyptus (OLE), or IR3535. Consider spraying your clothing with a pesticide called permethrin. Permethrin   helps prevent insect bites. It works for several weeks and for up to 5-6 clothing washes. Do not apply permethrin directly to the skin. If your home windows do not have screens, consider installing them. If you will be sleeping in  an area where there are mosquitoes, consider covering your sleeping area with a mosquito net. Contact a health care provider if: You have redness, swelling, or pain in the bite area. You have fluid or blood coming from the bite area. The bite area feels warm to the touch. You have pus or a bad smell coming from the bite area. You have a fever. Get help right away if: You have joint pain. You have a rash. You feel unusually tired or sleepy. You have neck pain. You have a headache. You have unusual weakness. You develop symptoms of an anaphylactic reaction. These may include: Flushed skin. Hives. Swelling of the eyes, lips, face, mouth, tongue, or throat. Difficulty breathing, speaking, or swallowing. Wheezing. Dizziness or light-headedness. Fainting. Pain or cramping in the abdomen. Vomiting. Diarrhea. These symptoms may represent a serious problem that is an emergency. Do not wait to see if the symptoms will go away. Do the following right away: Use the auto-injector pen as you have been instructed. Get medical help. Call your local emergency services (911 in the [place] Do not drive yourself to the hospital. Summary An insect bite can make your skin red, itchy, and swollen. Treatment is usually not needed. Symptoms often go away on their own. When treatment is recommended, it may involve taking medicine, applying medicine to the area, or applying ice. Apply or take over-the-counter and prescription medicines only as told by your health care provider. Use insect repellent to help prevent insect bites. Contact a health care provider if you have any signs of infection in the bite area. This information is not intended to replace advice given to you by your health care provider. Make sure you discuss any questions you have with your health care provider. Document Revised: 05/15/2021 Document Reviewed: 05/15/2021 Elsevier Patient Education  2023 [employer] Inc[employer]  

## 2022-03-26 DIAGNOSIS — H16231 Neurotrophic keratoconjunctivitis, right eye: Secondary | ICD-10-CM | POA: Diagnosis not present

## 2022-03-26 DIAGNOSIS — H168 Other keratitis: Secondary | ICD-10-CM | POA: Diagnosis not present

## 2022-05-10 DIAGNOSIS — L298 Other pruritus: Secondary | ICD-10-CM | POA: Diagnosis not present

## 2022-05-10 DIAGNOSIS — Z08 Encounter for follow-up examination after completed treatment for malignant neoplasm: Secondary | ICD-10-CM | POA: Diagnosis not present

## 2022-05-10 DIAGNOSIS — Z85828 Personal history of other malignant neoplasm of skin: Secondary | ICD-10-CM | POA: Diagnosis not present

## 2022-05-10 DIAGNOSIS — L820 Inflamed seborrheic keratosis: Secondary | ICD-10-CM | POA: Diagnosis not present

## 2022-05-10 DIAGNOSIS — L218 Other seborrheic dermatitis: Secondary | ICD-10-CM | POA: Diagnosis not present

## 2022-05-10 DIAGNOSIS — C4442 Squamous cell carcinoma of skin of scalp and neck: Secondary | ICD-10-CM | POA: Diagnosis not present

## 2022-05-10 DIAGNOSIS — L570 Actinic keratosis: Secondary | ICD-10-CM | POA: Diagnosis not present

## 2022-05-10 DIAGNOSIS — D485 Neoplasm of uncertain behavior of skin: Secondary | ICD-10-CM | POA: Diagnosis not present

## 2022-05-16 DIAGNOSIS — H16211 Exposure keratoconjunctivitis, right eye: Secondary | ICD-10-CM | POA: Diagnosis not present

## 2022-05-16 DIAGNOSIS — H04121 Dry eye syndrome of right lacrimal gland: Secondary | ICD-10-CM | POA: Diagnosis not present

## 2022-05-16 DIAGNOSIS — H16231 Neurotrophic keratoconjunctivitis, right eye: Secondary | ICD-10-CM | POA: Diagnosis not present

## 2022-05-16 DIAGNOSIS — H02411 Mechanical ptosis of right eyelid: Secondary | ICD-10-CM | POA: Diagnosis not present

## 2022-05-22 DIAGNOSIS — D044 Carcinoma in situ of skin of scalp and neck: Secondary | ICD-10-CM | POA: Diagnosis not present

## 2022-05-27 IMAGING — MR MR HEAD WO/W CM
9 of 14 series · 18 of 48 positions shown · IV contrast (cc gad)
Comparison: MRI brain 12/31/2018. MR head without and with contrast
06/11/2014

CLINICAL DATA: Status post meningioma resection.

EXAM:
MRI HEAD WITHOUT AND WITH CONTRAST
TECHNIQUE: Multiplanar, multiecho pulse sequences of the brain and surrounding
structures were obtained without and with intravenous contrast.
CONTRAST:  8mL GADAVIST GADOBUTROL 1 MMOL/ML IV SOLN

[Series 3: FLAIR · sagittal · 3.0mm · 0.51mm/px · 1 of 39 slices shown (1 of 2)]
[im 1/39]
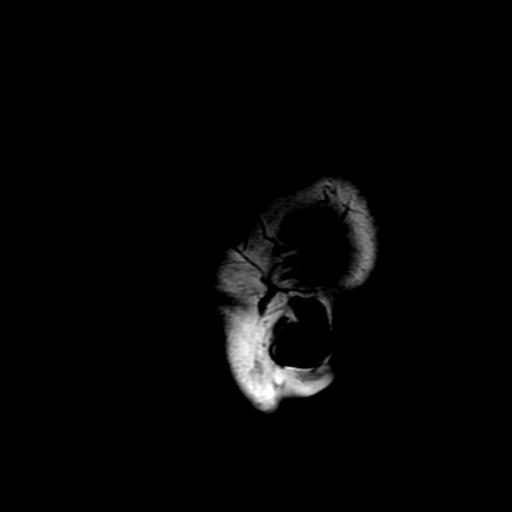

[Series 4: DWI · axial · 3.0mm · 0.94mm/px · z∈[+15,+153]mm · 3 of 96 slices shown]
[im 1/96]
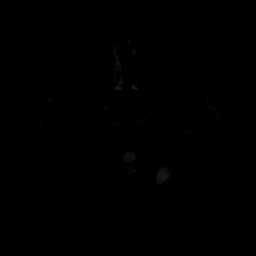
[im 48/96]
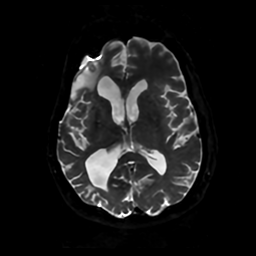
[im 96/96]
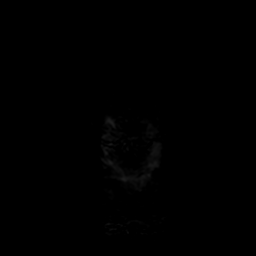

[Series 5: FLAIR · axial · 3.0mm · 0.49mm/px · 1 of 54 slices shown (2 of 2)]
[im 1/54]
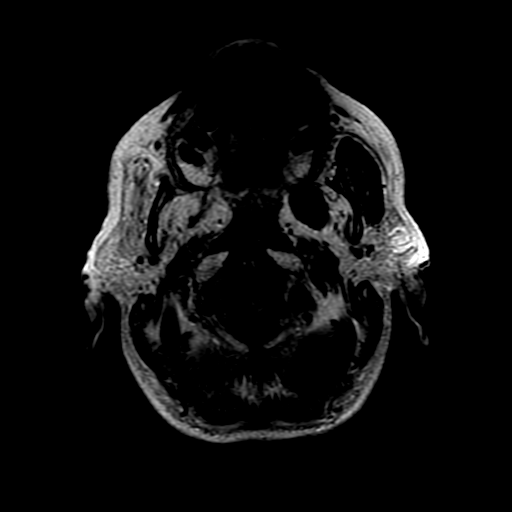

[Series 8: T2 post-contrast · coronal · 3.0mm · 0.39mm/px · 1 of 45 slices shown (1 of 2)]
[im 1/45]
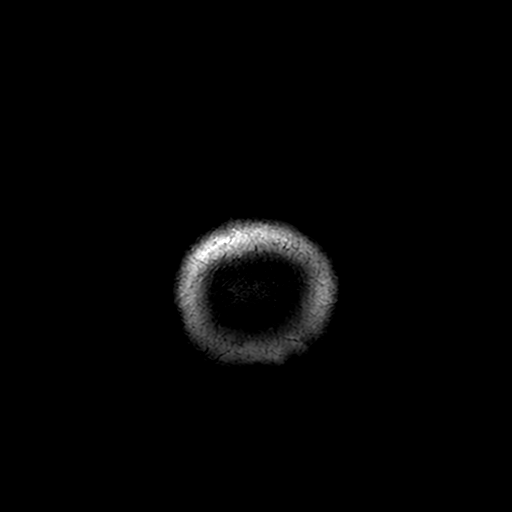

[Series 9: T2 post-contrast · axial · 5.0mm · 0.23mm/px · 1 of 25 slices shown (2 of 2)]
[im 1/25]
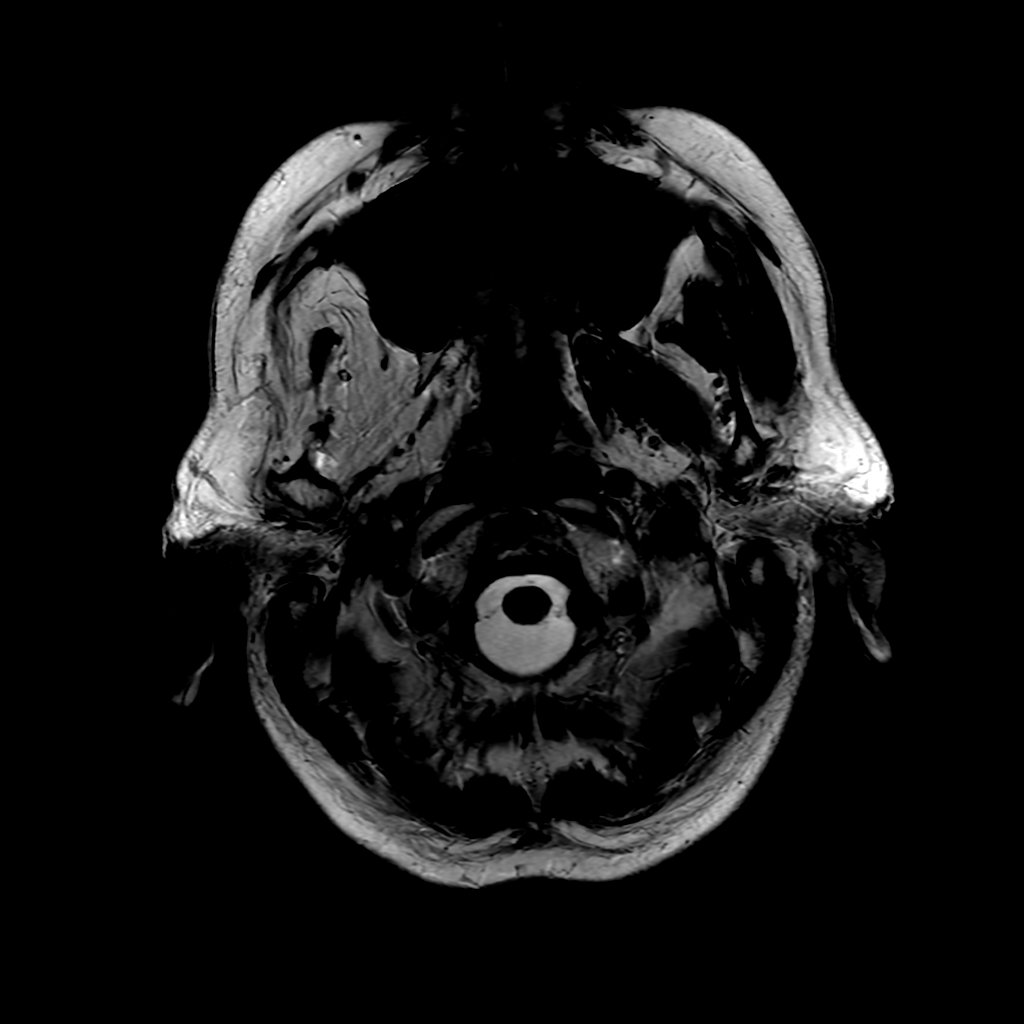

[Series 10: T1 post-contrast · coronal · 3.0mm · 0.43mm/px · 1 of 45 slices shown (1 of 2)]
[im 1/45]
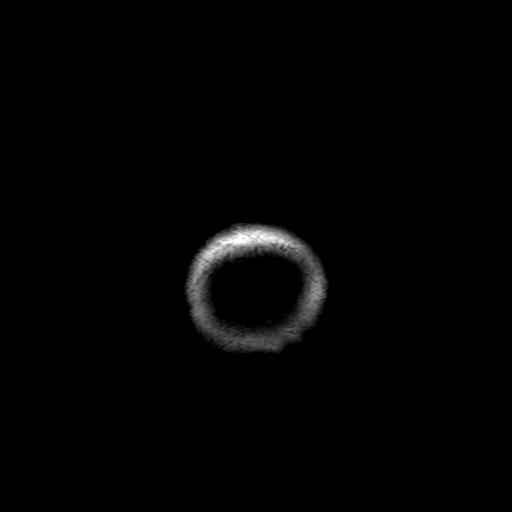

[Series 11: FLAIR post-contrast · sagittal · 3.0mm · 0.51mm/px · 1 of 39 slices shown]
[im 1/39]
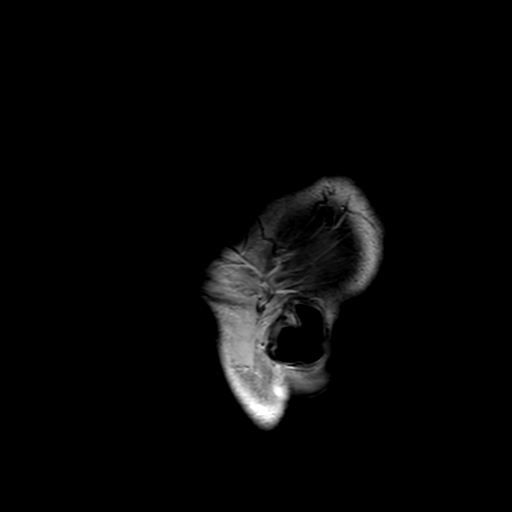

[Series 450: ADC · axial · 3.0mm · 0.94mm/px · 1 of 48 slices shown]
[im 1/48]
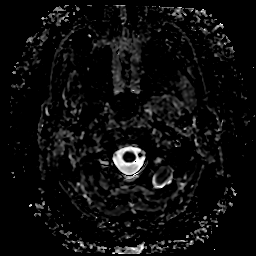

[Series 1200: T1 post-contrast · axial · 0.9mm · 0.50mm/px · z∈[-93,+163]mm · 8 of 302 slices shown (2 of 2)]
[im 1/302]
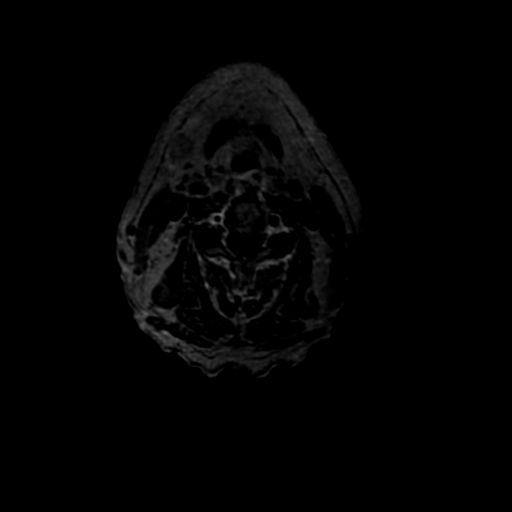
[im 44/302]
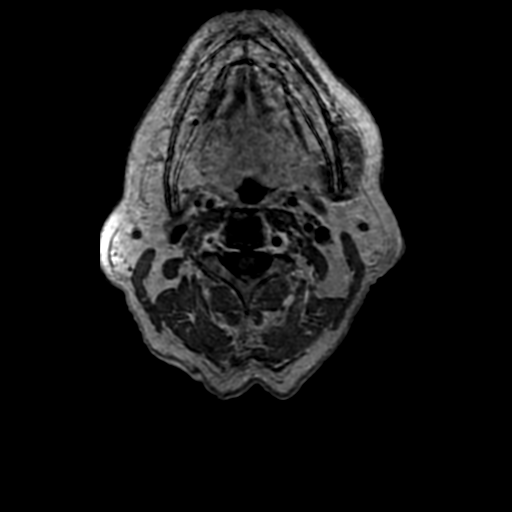
[im 87/302]
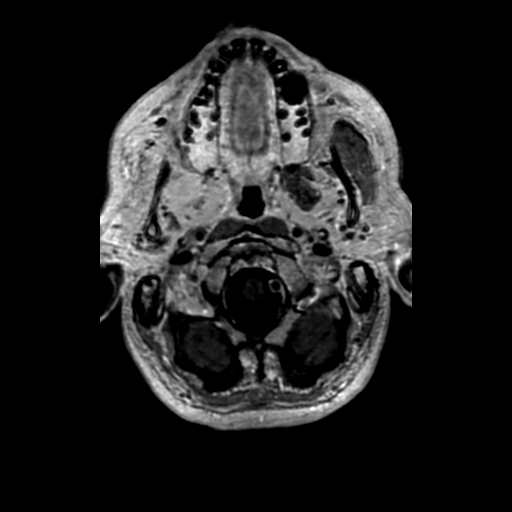
[im 130/302]
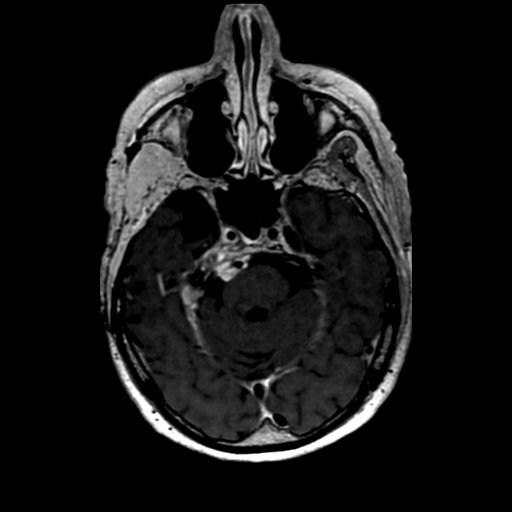
[im 173/302]
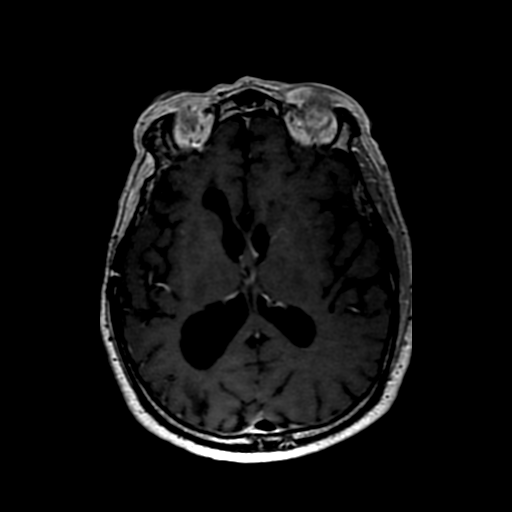
[im 216/302]
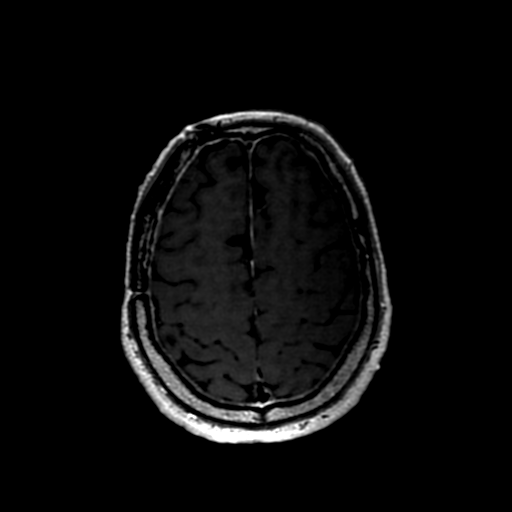
[im 259/302]
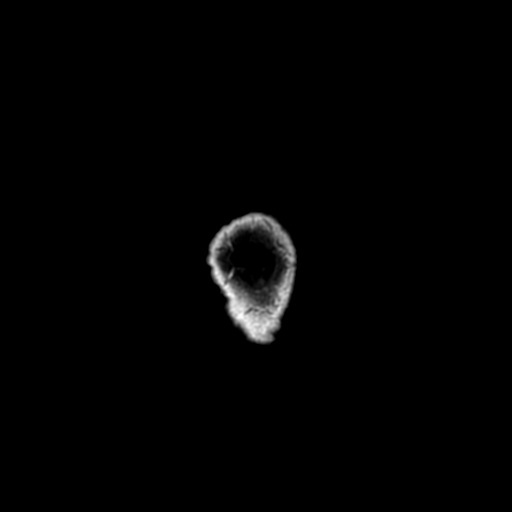
[im 302/302]
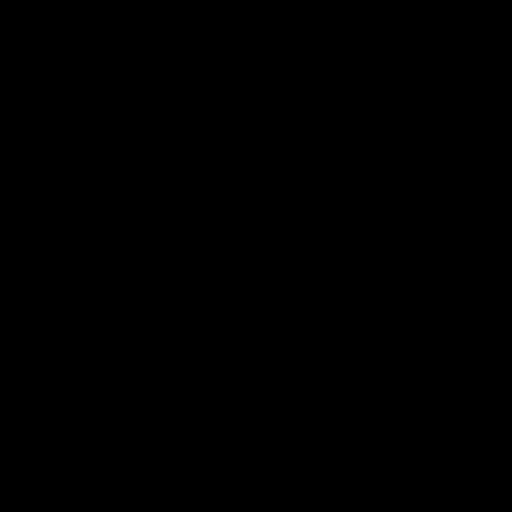

[18 of 48 positions shown; findings below may reference images not displayed]

FINDINGS: Brain: Patient is status post right frontal craniotomy. Right
temporal encephalomalacia is stable.

The portion of the residual meningioma extending from the
undersurface the tentorium and along the clivus on the right
extending to the basilar artery is stable over the last 2 exams to
slightly decreased in size. The enhancement pattern is somewhat
heterogeneous. Maximum dimension is 12 mm.

The portion along the superior aspect of the tentorium measures 18 x
8.5 mm on coronal images this lesion and is increasing in size over
time.

No new dural-based lesions are present. Remote infarct in the right
parietal lobe is stable.

Enhancement along the anterior margin of the craniotomy is stable,
adjacent to the frontal lobe.

No significant changes are present in the left hemisphere.

Ex vacuo dilation of the right lateral ventricle noted.

A remote lacunar infarct is present in the inferior left cerebellum,
new from the prior exam. No other new ischemic changes are present.

Vascular: Flow is present in the major intracranial arteries.

Skull and upper cervical spine: The craniocervical junction is
normal. Upper cervical spine is within normal limits. Marrow signal
is unremarkable.

Sinuses/Orbits: The paranasal sinuses and mastoid air cells are
clear. Right lens replacement noted. Globes and orbits are otherwise
within normal limits.
IMPRESSION: 1. Mixed response of residual meningioma extending from the
undersurface the tentorium and along the right clivus extending to
the basilar artery. The portion below the tentorium appears to be
smaller. The portion of the tentorium has increased in size over the
last 2 studies.
2. No new dural-based lesions.
3. Remote infarct of the right parietal lobe is stable.
4. Stable encephalomalacia involving the right frontal lobe and
temporal lobe
5. Remote lacunar infarct in the inferior left cerebellum, new from
the prior exam.

## 2022-06-08 ENCOUNTER — Ambulatory Visit: Payer: BC Managed Care – PPO | Admitting: Family

## 2022-06-11 ENCOUNTER — Encounter: Payer: Self-pay | Admitting: Family

## 2022-06-11 ENCOUNTER — Ambulatory Visit: Payer: BC Managed Care – PPO | Admitting: Family

## 2022-06-11 VITALS — BP 122/75 | HR 60 | Temp 97.4°F | Ht 73.0 in | Wt 184.4 lb

## 2022-06-11 DIAGNOSIS — F5101 Primary insomnia: Secondary | ICD-10-CM

## 2022-06-11 DIAGNOSIS — Z0001 Encounter for general adult medical examination with abnormal findings: Secondary | ICD-10-CM | POA: Diagnosis not present

## 2022-06-11 DIAGNOSIS — F1320 Sedative, hypnotic or anxiolytic dependence, uncomplicated: Secondary | ICD-10-CM | POA: Diagnosis not present

## 2022-06-11 DIAGNOSIS — J329 Chronic sinusitis, unspecified: Secondary | ICD-10-CM

## 2022-06-11 DIAGNOSIS — Z0000 Encounter for general adult medical examination without abnormal findings: Secondary | ICD-10-CM

## 2022-06-11 DIAGNOSIS — Z79899 Other long term (current) drug therapy: Secondary | ICD-10-CM

## 2022-06-11 DIAGNOSIS — D329 Benign neoplasm of meninges, unspecified: Secondary | ICD-10-CM

## 2022-06-11 MED ORDER — ALPRAZOLAM 1 MG PO TABS: 1.0000 mg | ORAL_TABLET | Freq: Every evening | ORAL | 2 refills | Status: DC | PRN

## 2022-06-11 NOTE — Patient Instructions (Signed)

## 2022-06-11 NOTE — Progress Notes (Signed)
Subjective:    Patient ID: Frank Hayden, male    DOB: [birth year], 66 y.o.   MRN: [id number]  Chief Complaint  Patient presents with   Medical Management of Chronic Issues   Pt presents to the office today for  CPE and chronic follow up. He had a meningioma partially removed in 02/1999 that involved is right sinus cavity, optic nerve, and carotid artery. He completed radiation. He is followed by a Neurosurgeon annually to continue to monitor and has another meningioma.  He takes 1 mg Xanax to help with nerve pain on side of face.    He is followed by Dermatologists every 6 months. He is followed by Ophthalmologist every 4 months. Insomnia Primary symptoms: difficulty falling asleep, frequent awakening.   The current episode started more than one year. The onset quality is gradual. The problem occurs intermittently. Past treatments include medication. The treatment provided moderate relief.  Anxiety Presents for follow-up visit. Symptoms include depressed mood, excessive worry, insomnia, irritability, nervous/anxious behavior and restlessness. Symptoms occur most days. The severity of symptoms is moderate.        Review of Systems  Constitutional:  Positive for irritability.  Psychiatric/Behavioral:  The patient is nervous/anxious and has insomnia.   All other systems reviewed and are negative.  Family History  Problem Relation Age of Onset   Heart disease Mother        Heart enlarged and mechanical valve surgery.   Hypertension Mother    Heart disease Father        5 bypass   Hypertension Father    Social History   Socioeconomic History   Marital status: Married    Spouse name: Not on file   Number of children: Not on file   Years of education: Not on file   Highest education level: Not on file  Occupational History   Not on file  Tobacco Use   Smoking status: Former   Smokeless tobacco: Never   Tobacco comments:    quit 40 years ago  Vaping Use   Vaping Use:  Never used  Substance and Sexual Activity   Alcohol use: No   Drug use: No   Sexual activity: Not on file  Other Topics Concern   Not on file  Social History Narrative   Not on file   Social Determinants of Health   Financial Resource Strain: Not on file  Food Insecurity: Not on file  Transportation Needs: Not on file  Physical Activity: Not on file  Stress: Not on file  Social Connections: Not on file       Objective:   Physical Exam Vitals reviewed.  Constitutional:      General: He is not in acute distress.    Appearance: He is well-developed.  HENT:     Head: Normocephalic.     Right Ear: Tympanic membrane normal.     Left Ear: Tympanic membrane normal.  Eyes:     General:        Right eye: No discharge.        Left eye: No discharge.     Pupils: Pupils are equal, round, and reactive to light.     Comments: Drooping of right eye  Neck:     Thyroid: No thyromegaly.  Cardiovascular:     Rate and Rhythm: Normal rate and regular rhythm.     Heart sounds: Normal heart sounds. No murmur heard. Pulmonary:     Effort: Pulmonary effort is normal. No respiratory  distress.     Breath sounds: Normal breath sounds. No wheezing.  Abdominal:     General: Bowel sounds are normal. There is no distension.     Palpations: Abdomen is soft.     Tenderness: There is no abdominal tenderness.  Musculoskeletal:        General: No tenderness. Normal range of motion.     Cervical back: Normal range of motion and neck supple.  Skin:    General: Skin is warm and dry.     Findings: No erythema or rash.  Neurological:     Mental Status: He is alert and oriented to person, place, and time.     Cranial Nerves: No cranial nerve deficit.     Deep Tendon Reflexes: Reflexes are normal and symmetric.  Psychiatric:        Behavior: Behavior normal.        Thought Content: Thought content normal.        Judgment: Judgment normal.      BP 122/75   Pulse 60   Temp (!) 97.4 F (36.3 C)  (Temporal)   Ht _0  (1.854 m)   Wt 184 lb 6.4 oz (83.6 kg)   BMI 24.33 kg/m       Assessment & Plan:  Frank Hayden comes in today with chief complaint of Medical Management of Chronic Issues   Diagnosis and orders addressed:  1. Primary insomnia - ALPRAZolam (XANAX) 1 MG tablet; Take 1 tablet (1 mg total) by mouth at bedtime as needed for anxiety.  Dispense: 30 tablet; Refill: 2 - ToxASSURE Select 13 ([facility]), Urine - CMP14+EGFR - CBC with Differential/Platelet  2. Controlled substance agreement signed - ALPRAZolam (XANAX) 1 MG tablet; Take 1 tablet (1 mg total) by mouth at bedtime as needed for anxiety.  Dispense: 30 tablet; Refill: 2 - ToxASSURE Select 13 ([facility]), Urine - CMP14+EGFR - CBC with Differential/Platelet  3. Benzodiazepine dependence ([facility]) - CMP14+EGFR - CBC with Differential/Platelet  4. Meningioma ([facility]) - CMP14+EGFR - CBC with Differential/Platelet  5. Chronic sinusitis, unspecified location - CMP14+EGFR - CBC with Differential/Platelet  6. Annual physical exam - ToxASSURE Select 13 ([facility]), Urine - CMP14+EGFR - CBC with Differential/Platelet - Lipid panel - PSA, total and free - TSH   Labs pending Patient reviewed in [place] controlled database, no flags noted. Contract and drug screen up dated today.  Health Maintenance reviewed Diet and exercise encouraged  Follow up plan: 3 months    Evelina Dun, FNP

## 2022-06-12 LAB — CMP14+EGFR
ALT: 15 IU/L (ref 0–44)
AST: 12 IU/L (ref 0–40)
Albumin/Globulin Ratio: 2.4 — ABNORMAL HIGH (ref 1.2–2.2)
Albumin: 4.6 g/dL (ref 3.9–4.9)
Alkaline Phosphatase: 67 IU/L (ref 44–121)
BUN/Creatinine Ratio: 13 (ref 10–24)
BUN: 13 mg/dL (ref 8–27)
Bilirubin Total: 0.5 mg/dL (ref 0.0–1.2)
CO2: 25 mmol/L (ref 20–29)
Calcium: 9 mg/dL (ref 8.6–10.2)
Chloride: 102 mmol/L (ref 96–106)
Creatinine, Ser: 1.01 mg/dL (ref 0.76–1.27)
Globulin, Total: 1.9 g/dL (ref 1.5–4.5)
Glucose: 88 mg/dL (ref 70–99)
Potassium: 4.1 mmol/L (ref 3.5–5.2)
Sodium: 141 mmol/L (ref 134–144)
Total Protein: 6.5 g/dL (ref 6.0–8.5)
eGFR: 82 mL/min/{1.73_m2} (ref >59)

## 2022-06-12 LAB — CBC WITH DIFFERENTIAL/PLATELET
Basophils Absolute: 0 10*3/uL (ref 0.0–0.2)
Basos: 1 %
EOS (ABSOLUTE): 0.1 10*3/uL (ref 0.0–0.4)
Eos: 2 %
Hematocrit: 46 % (ref 37.5–51.0)
Hemoglobin: 15.6 g/dL (ref 13.0–17.7)
Immature Grans (Abs): 0 10*3/uL (ref 0.0–0.1)
Immature Granulocytes: 0 %
Lymphocytes Absolute: 0.9 10*3/uL (ref 0.7–3.1)
Lymphs: 15 %
MCH: 29.8 pg (ref 26.6–33.0)
MCHC: 33.9 g/dL (ref 31.5–35.7)
MCV: 88 fL (ref 79–97)
Monocytes Absolute: 0.6 10*3/uL (ref 0.1–0.9)
Monocytes: 9 %
Neutrophils Absolute: 4.7 10*3/uL (ref 1.4–7.0)
Neutrophils: 73 %
Platelets: 191 10*3/uL (ref 150–450)
RBC: 5.23 x10E6/uL (ref 4.14–5.80)
RDW: 13.6 % (ref 11.6–15.4)
WBC: 6.3 10*3/uL (ref 3.4–10.8)

## 2022-06-12 LAB — PSA, TOTAL AND FREE
PSA, Free Pct: 61 %
PSA, Free: 0.61 ng/mL
Prostate Specific Ag, Serum: 1 ng/mL (ref 0.0–4.0)

## 2022-06-12 LAB — LIPID PANEL
Chol/HDL Ratio: 8.5 ratio — ABNORMAL HIGH (ref 0.0–5.0)
Cholesterol, Total: 271 mg/dL — ABNORMAL HIGH (ref 100–199)
HDL: 32 mg/dL — ABNORMAL LOW (ref >39)
LDL Chol Calc (NIH): 184 mg/dL — ABNORMAL HIGH (ref 0–99)
Triglycerides: 281 mg/dL — ABNORMAL HIGH (ref 0–149)
VLDL Cholesterol Cal: 55 mg/dL — ABNORMAL HIGH (ref 5–40)

## 2022-06-12 LAB — TSH: TSH: 0.814 u[IU]/mL (ref 0.450–4.500)

## 2022-06-14 ENCOUNTER — Other Ambulatory Visit: Payer: Self-pay | Admitting: Family

## 2022-06-14 DIAGNOSIS — T466X5A Adverse effect of antihyperlipidemic and antiarteriosclerotic drugs, initial encounter: Secondary | ICD-10-CM | POA: Insufficient documentation

## 2022-06-14 LAB — TOXASSURE SELECT 13 (MW), URINE

## 2022-06-14 MED ORDER — ROSUVASTATIN CALCIUM 10 MG PO TABS: 5.0000 mg | ORAL_TABLET | Freq: Every day | ORAL | 3 refills | Status: DC

## 2022-06-19 DIAGNOSIS — C4442 Squamous cell carcinoma of skin of scalp and neck: Secondary | ICD-10-CM | POA: Diagnosis not present

## 2022-06-19 DIAGNOSIS — D485 Neoplasm of uncertain behavior of skin: Secondary | ICD-10-CM | POA: Diagnosis not present

## 2022-06-19 DIAGNOSIS — C44329 Squamous cell carcinoma of skin of other parts of face: Secondary | ICD-10-CM | POA: Diagnosis not present

## 2022-07-10 DIAGNOSIS — C44329 Squamous cell carcinoma of skin of other parts of face: Secondary | ICD-10-CM | POA: Diagnosis not present

## 2022-07-16 ENCOUNTER — Encounter: Payer: Self-pay | Admitting: Family Medicine

## 2022-07-16 ENCOUNTER — Telehealth: Payer: BC Managed Care – PPO | Admitting: Family Medicine

## 2022-07-16 DIAGNOSIS — J0111 Acute recurrent frontal sinusitis: Secondary | ICD-10-CM

## 2022-07-16 MED ORDER — DOXYCYCLINE HYCLATE 100 MG PO TABS: 100.0000 mg | ORAL_TABLET | Freq: Two times a day (BID) | ORAL | 0 refills | Status: AC

## 2022-07-16 NOTE — Progress Notes (Signed)
   Virtual Visit via video Note   Due to COVID-19 pandemic this visit was conducted virtually. This visit type was conducted due to national recommendations for restrictions regarding the COVID-19 Pandemic (e.g. social distancing, sheltering in place) in an effort to limit this patient's exposure and mitigate transmission in our community. All issues noted in this document were discussed and addressed.  A physical exam was not performed with this format.  I connected with  Frank Hayden  on 07/16/22 at 0928 by video and verified that I am speaking with the correct person using two identifiers. Frank Hayden is currently located at home and his wife is currently with him during the visit. The provider, Gwenlyn Perking, FNP is located in their office at time of visit.  I discussed the limitations, risks, security and privacy concerns of performing an evaluation and management service by video  and the availability of in person appointments. I also discussed with the patient that there may be a patient responsible charge related to this service. The patient expressed understanding and agreed to proceed.  CC: sinusitis  History and Present Illness:  Frank Hayden reports worsening sinusitis symptoms. He has a history of recurrent sinusitis. He reports congestion and rhinorrhea for the last 6-8 weeks that has been worsening over the last week. He also reports a HA with frontal sinus pressure and pain along with right ear pressure.  He also has a dry cough and postnasal drip. He denies fever, shortness of breath, or chest pain. He takes xyzal and flonase daily for allergies. He has also been taking mucinex with little relief.    ROS As per HPI.    Observations/Objective: Alert and oriented x 3. Able to speak in full sentences without difficulty. No respiratory distress or cyanosis noted. Normal mood and behavior.   Assessment and Plan: Vence was seen today for sinusitis.  Diagnoses and all  orders for this visit:  Acute recurrent frontal sinusitis Doxycyline as below. Continue mucinex, flonase, and xyzal. Discussed symptomatic care and return precautions.  -     doxycycline (VIBRA-TABS) 100 MG tablet; Take 1 tablet (100 mg total) by mouth 2 (two) times daily for 7 days. 1 po bid   Follow Up Instructions: As needed.     I discussed the assessment and treatment plan with the patient. The patient was provided an opportunity to ask questions and all were answered. The patient agreed with the plan and demonstrated an understanding of the instructions.   The patient was advised to call back or seek an in-person evaluation if the symptoms worsen or if the condition fails to improve as anticipated.  The above assessment and management plan was discussed with the patient. The patient verbalized understanding of and has agreed to the management plan. Patient is aware to call the clinic if symptoms persist or worsen. Patient is aware when to return to the clinic for a follow-up visit. Patient educated on when it is appropriate to go to the emergency department.   Time call ended: 0935  I provided 7 minutes of face-to-face time during this encounter.    Gwenlyn Perking, FNP

## 2022-08-13 DIAGNOSIS — L905 Scar conditions and fibrosis of skin: Secondary | ICD-10-CM | POA: Diagnosis not present

## 2022-08-13 DIAGNOSIS — L570 Actinic keratosis: Secondary | ICD-10-CM | POA: Diagnosis not present

## 2022-08-13 DIAGNOSIS — L578 Other skin changes due to chronic exposure to nonionizing radiation: Secondary | ICD-10-CM | POA: Diagnosis not present

## 2022-08-29 ENCOUNTER — Telehealth: Payer: BC Managed Care – PPO | Admitting: Nurse Practitioner

## 2022-08-29 DIAGNOSIS — J0111 Acute recurrent frontal sinusitis: Secondary | ICD-10-CM | POA: Diagnosis not present

## 2022-08-29 MED ORDER — FLUTICASONE PROPIONATE 50 MCG/ACT NA SUSP: 2.0000 | Freq: Every day | NASAL | 6 refills | Status: AC

## 2022-08-29 MED ORDER — DOXYCYCLINE HYCLATE 100 MG PO TABS: 100.0000 mg | ORAL_TABLET | Freq: Two times a day (BID) | ORAL | 0 refills | Status: DC

## 2022-08-29 NOTE — Progress Notes (Signed)
Virtual Visit via Video Note   This visit type was conducted due to national recommendations for restrictions regarding the COVID-19 Pandemic (e.g. social distancing) in an effort to limit this patient's exposure and mitigate transmission in our community.  Due to his co-morbid illnesses, this patient is at least at moderate risk for complications without adequate follow up.  This format is felt to be most appropriate for this patient at this time.  All issues noted in this document were discussed and addressed.  A limited physical exam was performed with this format.  A verbal consent was obtained for the virtual visit.   Date:  08/29/2022   ID:  Frank Hayden, DOB 27-Aug-1956, MRN [id number]  Patient Location: Home Provider Location: Office/Clinic  PCP:  Sharion Balloon, FNP   Evaluation Performed: Acute care visit Chief Complaint: Sinusitis  History of Present Illness:    Frank Hayden is a 67 y.o. male with Sinusitis: Patient presents with chronic sinusitis. The patient reports chronic sinus infections for 4 days.  His symptoms include nasal congestion, purulent rhinorrhea, ear pain.  There has not been a history of facial pain, apnea during sleep. There is been a history of chronic otitis media or pharyngotonsillitis.  Prior antibiotic therapy has included Amoxicillin, Augmentin. Other medications have included Flonase.  He not had allergy testing which was .     The patient does not have symptoms concerning for COVID-19 infection (fever, chills, cough, or new shortness of breath).    Past Medical History:  Diagnosis Date   Anxiety    Has headaches associated with past surgery.   Cancer ([place])    Brain    Hearing loss    decreased 95% right / decreased 24% left ear    Past Surgical History:  Procedure Laterality Date   APPENDECTOMY     EYE SURGERY Right    Tumor removed behind eye.   TONSILLECTOMY      Family History  Problem Relation Age of Onset   Heart disease  Mother        Heart enlarged and mechanical valve surgery.   Hypertension Mother    Heart disease Father        5 bypass   Hypertension Father     Social History   Socioeconomic History   Marital status: Married    Spouse name: Not on file   Number of children: Not on file   Years of education: Not on file   Highest education level: Not on file  Occupational History   Not on file  Tobacco Use   Smoking status: Former   Smokeless tobacco: Never   Tobacco comments:    quit 40 years ago  Vaping Use   Vaping Use: Never used  Substance and Sexual Activity   Alcohol use: No   Drug use: No   Sexual activity: Not on file  Other Topics Concern   Not on file  Social History Narrative   Not on file   Social Determinants of Health   Financial Resource Strain: Not on file  Food Insecurity: Not on file  Transportation Needs: Not on file  Physical Activity: Not on file  Stress: Not on file  Social Connections: Not on file  Intimate Partner Violence: Not on file    Outpatient Medications Prior to Visit  Medication Sig Dispense Refill   acetaminophen (TYLENOL) 500 MG tablet Take 1 tablet (500 mg total) by mouth every 6 (six) hours as needed. 30 tablet  0   ALPRAZolam (XANAX) 1 MG tablet Take 1 tablet (1 mg total) by mouth at bedtime as needed for anxiety. 30 tablet 2   Bepotastine Besilate 1.5 % SOLN      Cenegermin-bkbj (OXERVATE) 0.002 % SOLN Apply to eye.     Cholecalciferol (VITAMIN D) 2000 units CAPS Take by mouth.     cycloSPORINE (RESTASIS) 0.05 % ophthalmic emulsion 1 drop 2 (two) times daily.     fluorometholone (FML) 0.1 % ophthalmic suspension      levocetirizine (XYZAL) 5 MG tablet Take 5 mg by mouth every evening.     MIEBO 1.338 GM/ML SOLN Apply 1 drop to eye 2 (two) times daily.     Potassium 99 MG TABS Take by mouth.     rosuvastatin (CRESTOR) 10 MG tablet Take 0.5 tablets (5 mg total) by mouth daily. 90 tablet 3   triamcinolone ointment (KENALOG) 0.5 % Apply 1  Application topically 2 (two) times daily. 30 g 0   TYRVAYA 0.03 MG/ACT SOLN Place 1 spray into both nostrils 2 (two) times daily.     UNABLE TO FIND Med Name: fluorouracil cream     vitamin B-12 (CYANOCOBALAMIN) 1000 MCG tablet Take 1,000 mcg by mouth daily.     vitamin E 400 UNIT capsule Take 400 Units by mouth daily.     Zinc Sulfate (ZINC 15 PO) Take by mouth.     fluticasone (FLONASE) 50 MCG/ACT nasal spray SPRAY 1 SPRAY IN EACH NOSTRIL ONCE DAILY. 16 g 2   No facility-administered medications prior to visit.    Allergies:   Atorvastatin, Carbamazepine, Pregabalin, Augmentin [amoxicillin-pot clavulanate], and Latex   Social History   Tobacco Use   Smoking status: Former   Smokeless tobacco: Never   Tobacco comments:    quit 40 years ago  Vaping Use   Vaping Use: Never used  Substance Use Topics   Alcohol use: No   Drug use: No     Review of Systems  Constitutional: Negative.  Negative for fever.  HENT:  Positive for ear discharge and ear pain. Negative for tinnitus.   Respiratory: Negative.    Cardiovascular: Negative.   Musculoskeletal: Negative.   Skin: Negative.  Negative for itching and rash.  All other systems reviewed and are negative.    Labs/Other Tests and Data Reviewed:    Recent Labs: 06/11/2022: ALT 15; BUN 13; Creatinine, Ser 1.01; Hemoglobin 15.6; Platelets 191; Potassium 4.1; Sodium 141; TSH 0.814   Recent Lipid Panel Lab Results  Component Value Date/Time   CHOL 271 (H) 06/11/2022 02:07 PM   TRIG 281 (H) 06/11/2022 02:07 PM   HDL 32 (L) 06/11/2022 02:07 PM   CHOLHDL 8.5 (H) 06/11/2022 02:07 PM   LDLCALC 184 (H) 06/11/2022 02:07 PM    Wt Readings from Last 3 Encounters:  06/11/22 184 lb 6.4 oz (83.6 kg)  03/02/22 185 lb 3.2 oz (84 kg)  02/06/22 188 lb (85.3 kg)     Objective:    Vital Signs:  There were no vitals taken for this visit.   Physical Exam Vitals and nursing note reviewed.  Constitutional:      Appearance: Normal  appearance.  Neurological:     Mental Status: He is alert.   Limited physical assessment due to virtual visit.  ASSESSMENT & PLAN:   1. Acute recurrent frontal sinusitis - doxycycline (VIBRA-TABS) 100 MG tablet; Take 1 tablet (100 mg total) by mouth 2 (two) times daily.  Dispense: 14 tablet; Refill: 0 -  fluticasone (FLONASE) 50 MCG/ACT nasal spray; Place 2 sprays into both nostrils daily.  Dispense: 16 g; Refill: 6   Patient presents with symptoms of sinusitis.  Patient has a history of chronic sinus infections. Take meds as prescribed - Use a cool mist humidifier  -Use saline nose sprays frequently -Force fluids -For fever or aches or pains- take Tylenol or ibuprofen. -If symptoms do not improve, she may need to be COVID tested to rule this out Follow up with worsening unresolved symptoms    Meds ordered this encounter  Medications   doxycycline (VIBRA-TABS) 100 MG tablet    Sig: Take 1 tablet (100 mg total) by mouth 2 (two) times daily.    Dispense:  14 tablet    Refill:  0    Order Specific Question:   Supervising Provider    Answer:   Claretta Fraise [[id number]]   fluticasone (FLONASE) 50 MCG/ACT nasal spray    Sig: Place 2 sprays into both nostrils daily.    Dispense:  16 g    Refill:  6    Order Specific Question:   Supervising Provider    Answer:   Claretta Fraise [phone]    COVID-19 Education: The signs and symptoms of COVID-19 were discussed with the patient and how to seek care for testing (follow up with PCP or arrange E-visit). The importance of social distancing was discussed today.  Time:   Today, I have spent 8 minutes with the patient with telehealth technology discussing the above problems.    Follow Up:  Virtual Visit  prn  Signed, Ivy Lynn, NP  08/29/2022 5:46 PM    [place]

## 2022-08-29 NOTE — Patient Instructions (Signed)

## 2022-09-06 ENCOUNTER — Other Ambulatory Visit: Payer: Self-pay | Admitting: Family

## 2022-09-06 DIAGNOSIS — Z79899 Other long term (current) drug therapy: Secondary | ICD-10-CM

## 2022-09-06 DIAGNOSIS — F5101 Primary insomnia: Secondary | ICD-10-CM

## 2022-09-11 ENCOUNTER — Ambulatory Visit: Payer: BC Managed Care – PPO | Admitting: Family

## 2022-09-11 ENCOUNTER — Encounter: Payer: Self-pay | Admitting: Family

## 2022-09-11 VITALS — BP 132/80 | HR 65 | Temp 97.0°F | Ht 73.0 in | Wt 187.0 lb

## 2022-09-11 DIAGNOSIS — F411 Generalized anxiety disorder: Secondary | ICD-10-CM

## 2022-09-11 DIAGNOSIS — F5101 Primary insomnia: Secondary | ICD-10-CM

## 2022-09-11 DIAGNOSIS — Z79899 Other long term (current) drug therapy: Secondary | ICD-10-CM

## 2022-09-11 DIAGNOSIS — J329 Chronic sinusitis, unspecified: Secondary | ICD-10-CM | POA: Diagnosis not present

## 2022-09-11 DIAGNOSIS — M791 Myalgia, unspecified site: Secondary | ICD-10-CM

## 2022-09-11 DIAGNOSIS — D329 Benign neoplasm of meninges, unspecified: Secondary | ICD-10-CM | POA: Diagnosis not present

## 2022-09-11 DIAGNOSIS — T466X5A Adverse effect of antihyperlipidemic and antiarteriosclerotic drugs, initial encounter: Secondary | ICD-10-CM

## 2022-09-11 DIAGNOSIS — F1320 Sedative, hypnotic or anxiolytic dependence, uncomplicated: Secondary | ICD-10-CM

## 2022-09-11 MED ORDER — ALPRAZOLAM 1 MG PO TABS: 1.0000 mg | ORAL_TABLET | Freq: Every evening | ORAL | 2 refills | Status: DC | PRN

## 2022-09-11 NOTE — Patient Instructions (Signed)
Health Maintenance After Age 68 After age 68, you are at a higher risk for certain long-term diseases and infections as well as injuries from falls. Falls are a major cause of broken bones and head injuries in people who are older than age 68. Getting regular preventive care can help to keep you healthy and well. Preventive care includes getting regular testing and making lifestyle changes as recommended by your health care provider. Talk with your health care provider about: Which screenings and tests you should have. A screening is a test that checks for a disease when you have no symptoms. A diet and exercise plan that is right for you. What should I know about screenings and tests to prevent falls? Screening and testing are the best ways to find a health problem early. Early diagnosis and treatment give you the best chance of managing medical conditions that are common after age 68. Certain conditions and lifestyle choices may make you more likely to have a fall. Your health care provider may recommend: Regular vision checks. Poor vision and conditions such as cataracts can make you more likely to have a fall. If you wear glasses, make sure to get your prescription updated if your vision changes. Medicine review. Work with your health care provider to regularly review all of the medicines you are taking, including over-the-counter medicines. Ask your health care provider about any side effects that may make you more likely to have a fall. Tell your health care provider if any medicines that you take make you feel dizzy or sleepy. Strength and balance checks. Your health care provider may recommend certain tests to check your strength and balance while standing, walking, or changing positions. Foot health exam. Foot pain and numbness, as well as not wearing proper footwear, can make you more likely to have a fall. Screenings, including: Osteoporosis screening. Osteoporosis is a condition that causes  the bones to get weaker and break more easily. Blood pressure screening. Blood pressure changes and medicines to control blood pressure can make you feel dizzy. Depression screening. You may be more likely to have a fall if you have a fear of falling, feel depressed, or feel unable to do activities that you used to do. Alcohol use screening. Using too much alcohol can affect your balance and may make you more likely to have a fall. Follow these instructions at home: Lifestyle Do not drink alcohol if: Your health care provider tells you not to drink. If you drink alcohol: Limit how much you have to: 0-1 drink a day for women. 0-2 drinks a day for men. Know how much alcohol is in your drink. In the [place] one drink equals one 12 oz bottle of beer (355 mL), one 5 oz glass of wine (148 mL), or one 1 oz glass of hard liquor (44 mL). Do not use any products that contain nicotine or tobacco. These products include cigarettes, chewing tobacco, and vaping devices, such as e-cigarettes. If you need help quitting, ask your health care provider. Activity  Follow a regular exercise program to stay fit. This will help you maintain your balance. Ask your health care provider what types of exercise are appropriate for you. If you need a cane or walker, use it as recommended by your health care provider. Wear supportive shoes that have nonskid soles. Safety  Remove any tripping hazards, such as rugs, cords, and clutter. Install safety equipment such as grab bars in bathrooms and safety rails on stairs. Keep rooms and walkways   well-lit. General instructions Talk with your health care provider about your risks for falling. Tell your health care provider if: You fall. Be sure to tell your health care provider about all falls, even ones that seem minor. You feel dizzy, tiredness (fatigue), or off-balance. Take over-the-counter and prescription medicines only as told by your health care provider. These include  supplements. Eat a healthy diet and maintain a healthy weight. A healthy diet includes low-fat dairy products, low-fat (lean) meats, and fiber from whole grains, beans, and lots of fruits and vegetables. Stay current with your vaccines. Schedule regular health, dental, and eye exams. Summary Having a healthy lifestyle and getting preventive care can help to protect your health and wellness after age 68. Screening and testing are the best way to find a health problem early and help you avoid having a fall. Early diagnosis and treatment give you the best chance for managing medical conditions that are more common for people who are older than age 68. Falls are a major cause of broken bones and head injuries in people who are older than age 68. Take precautions to prevent a fall at home. Work with your health care provider to learn what changes you can make to improve your health and wellness and to prevent falls. This information is not intended to replace advice given to you by your health care provider. Make sure you discuss any questions you have with your health care provider. Document Revised: 01/02/2021 Document Reviewed: 01/02/2021 Elsevier Patient Education  2023 Elsevier Inc[employer]  

## 2022-09-11 NOTE — Progress Notes (Signed)
Subjective:    Patient ID: Frank Hayden, male    DOB: 24-Feb-1956, 67 y.o.   MRN: [id number]  Chief Complaint  Patient presents with   Medical Management of Chronic Issues   Pt presents to the office today for chronic follow up. He had a meningioma partially removed in 02/1999 that involved is right sinus cavity, optic nerve, and carotid artery. He completed radiation. He is followed by a Neurosurgeon annually to continue to monitor and has another meningioma.  He takes 1 mg Xanax to help with nerve pain on side of face.    He is followed by Dermatologists every 6 months. He is followed by Ophthalmologist every 4 months. Insomnia Primary symptoms: difficulty falling asleep, frequent awakening.   The current episode started more than one year. The onset quality is gradual. The problem occurs intermittently.  Anxiety Presents for follow-up visit. Symptoms include excessive worry, insomnia, nervous/anxious behavior and restlessness. Symptoms occur occasionally. The severity of symptoms is mild.        Review of Systems  Psychiatric/Behavioral:  The patient is nervous/anxious and has insomnia.   All other systems reviewed and are negative.      Objective:   Physical Exam Vitals reviewed.  Constitutional:      General: He is not in acute distress.    Appearance: He is well-developed.  HENT:     Head: Normocephalic.     Right Ear: Tympanic membrane normal.     Left Ear: Tympanic membrane normal.  Eyes:     General:        Right eye: No discharge.        Left eye: No discharge.     Pupils: Pupils are equal, round, and reactive to light.  Neck:     Thyroid: No thyromegaly.  Cardiovascular:     Rate and Rhythm: Normal rate and regular rhythm.     Heart sounds: Normal heart sounds. No murmur heard. Pulmonary:     Effort: Pulmonary effort is normal. No respiratory distress.     Breath sounds: Normal breath sounds. No wheezing.  Abdominal:     General: Bowel sounds are  normal. There is no distension.     Palpations: Abdomen is soft.     Tenderness: There is no abdominal tenderness.  Musculoskeletal:        General: No tenderness. Normal range of motion.     Cervical back: Normal range of motion and neck supple.  Skin:    General: Skin is warm and dry.     Findings: No erythema or rash.  Neurological:     Mental Status: He is alert and oriented to person, place, and time.     Cranial Nerves: No cranial nerve deficit.     Deep Tendon Reflexes: Reflexes are normal and symmetric.  Psychiatric:        Behavior: Behavior normal.        Thought Content: Thought content normal.        Judgment: Judgment normal.       BP 132/80   Pulse 65   Temp (!) 97 F (36.1 C) (Temporal)   Ht '6\' 1"'$  (1.854 m)   Wt 187 lb (84.8 kg)   SpO2 99%   BMI 24.67 kg/m      Assessment & Plan:   Frank Hayden comes in today with chief complaint of Medical Management of Chronic Issues   Diagnosis and orders addressed:  1. Primary insomnia - ALPRAZolam (XANAX) 1 MG  tablet; Take 1 tablet (1 mg total) by mouth at bedtime as needed for anxiety.  Dispense: 30 tablet; Refill: 2  2. Myalgia due to statin  3. Meningioma of right sphenoid wing involving cavernous sinus (HCC)  4. Meningioma ([place])  5. Controlled substance agreement signed - ALPRAZolam (XANAX) 1 MG tablet; Take 1 tablet (1 mg total) by mouth at bedtime as needed for anxiety.  Dispense: 30 tablet; Refill: 2  6. Chronic sinusitis, unspecified location  7. Benzodiazepine dependence ([place])  8. GAD (generalized anxiety disorder)   Labs pending Patient reviewed in [place] controlled database, no flags noted. Contract and drug screen are up to date.  Health Maintenance reviewed Diet and exercise encouraged  Follow up plan: 3 months    Evelina Dun, FNP

## 2022-09-17 DIAGNOSIS — Z961 Presence of intraocular lens: Secondary | ICD-10-CM | POA: Diagnosis not present

## 2022-09-17 DIAGNOSIS — H04129 Dry eye syndrome of unspecified lacrimal gland: Secondary | ICD-10-CM | POA: Diagnosis not present

## 2022-09-17 DIAGNOSIS — H538 Other visual disturbances: Secondary | ICD-10-CM | POA: Diagnosis not present

## 2022-09-17 DIAGNOSIS — H16231 Neurotrophic keratoconjunctivitis, right eye: Secondary | ICD-10-CM | POA: Diagnosis not present

## 2022-10-02 ENCOUNTER — Other Ambulatory Visit (HOSPITAL_COMMUNITY): Payer: Self-pay | Admitting: Neurological Surgery

## 2022-10-02 DIAGNOSIS — D329 Benign neoplasm of meninges, unspecified: Secondary | ICD-10-CM

## 2022-10-09 ENCOUNTER — Ambulatory Visit (HOSPITAL_COMMUNITY)
Admission: RE | Admit: 2022-10-09 | Discharge: 2022-10-09 | Disposition: A | Discharge status: Home / Self Care | Payer: BC Managed Care – PPO | Source: Ambulatory Visit | Attending: Neurological Surgery | Admitting: Neurological Surgery

## 2022-10-09 DIAGNOSIS — D496 Neoplasm of unspecified behavior of brain: Secondary | ICD-10-CM | POA: Diagnosis not present

## 2022-10-09 DIAGNOSIS — D329 Benign neoplasm of meninges, unspecified: Secondary | ICD-10-CM

## 2022-10-09 DIAGNOSIS — Z8673 Personal history of transient ischemic attack (TIA), and cerebral infarction without residual deficits: Secondary | ICD-10-CM | POA: Diagnosis not present

## 2022-10-09 DIAGNOSIS — G9389 Other specified disorders of brain: Secondary | ICD-10-CM | POA: Diagnosis not present

## 2022-10-09 MED ORDER — GADOBUTROL 1 MMOL/ML IV SOLN
8.0000 mL | Freq: Once | INTRAVENOUS | Status: AC | PRN
  Administered 2022-10-09: 8 mL via INTRAVENOUS

## 2022-10-10 DIAGNOSIS — H02411 Mechanical ptosis of right eyelid: Secondary | ICD-10-CM | POA: Diagnosis not present

## 2022-10-10 DIAGNOSIS — H04121 Dry eye syndrome of right lacrimal gland: Secondary | ICD-10-CM | POA: Diagnosis not present

## 2022-10-10 DIAGNOSIS — H16231 Neurotrophic keratoconjunctivitis, right eye: Secondary | ICD-10-CM | POA: Diagnosis not present

## 2022-10-10 DIAGNOSIS — H16211 Exposure keratoconjunctivitis, right eye: Secondary | ICD-10-CM | POA: Diagnosis not present

## 2022-10-25 ENCOUNTER — Ambulatory Visit: Payer: BC Managed Care – PPO | Admitting: Family Medicine

## 2022-10-25 ENCOUNTER — Encounter: Payer: Self-pay | Admitting: Family Medicine

## 2022-10-25 VITALS — BP 141/74 | HR 63 | Temp 97.3°F | Ht 73.0 in | Wt 186.6 lb

## 2022-10-25 DIAGNOSIS — H66011 Acute suppurative otitis media with spontaneous rupture of ear drum, right ear: Secondary | ICD-10-CM | POA: Diagnosis not present

## 2022-10-25 MED ORDER — CEFDINIR 300 MG PO CAPS: 300.0000 mg | ORAL_CAPSULE | Freq: Two times a day (BID) | ORAL | 0 refills | Status: AC

## 2022-10-25 NOTE — Progress Notes (Signed)
Subjective:  Patient ID: Frank Hayden, male    DOB: [birth year], 67 y.o.   MRN: [id number]  Patient Care Team: Sharion Balloon, FNP as PCP - General (Family Medicine)   Chief Complaint:  Nasal Congestion (Nasal drainage x 1 week ) and Ear Drainage (Right x 4 days)   HPI: Frank Hayden is a 67 y.o. male presenting on 10/25/2022 for Nasal Congestion (Nasal drainage x 1 week ) and Ear Drainage (Right x 4 days)   Ear Drainage  There is pain in the right ear. This is a new problem. The current episode started in the past 7 days. There has been no fever. The pain is moderate. Associated symptoms include coughing, ear discharge and rhinorrhea. Pertinent negatives include no abdominal pain, diarrhea, headaches, hearing loss, neck pain, rash, sore throat or vomiting. He has tried ear drops for the symptoms. The treatment provided no relief.    Relevant past medical, surgical, family, and social history reviewed and updated as indicated.  Allergies and medications reviewed and updated. Data reviewed: Chart in Epic.   Past Medical History:  Diagnosis Date   Anxiety    Has headaches associated with past surgery.   Cancer ([place])    Brain    Hearing loss    decreased 95% right / decreased 24% left ear    Past Surgical History:  Procedure Laterality Date   APPENDECTOMY     EYE SURGERY Right    Tumor removed behind eye.   TONSILLECTOMY      Social History   Socioeconomic History   Marital status: Married    Spouse name: Not on file   Number of children: Not on file   Years of education: Not on file   Highest education level: Not on file  Occupational History   Not on file  Tobacco Use   Smoking status: Former   Smokeless tobacco: Never   Tobacco comments:    quit 40 years ago  Vaping Use   Vaping Use: Never used  Substance and Sexual Activity   Alcohol use: No   Drug use: No   Sexual activity: Not on file  Other Topics Concern   Not on file  Social History  Narrative   Not on file   Social Determinants of Health   Financial Resource Strain: Not on file  Food Insecurity: Not on file  Transportation Needs: Not on file  Physical Activity: Not on file  Stress: Not on file  Social Connections: Not on file  Intimate Partner Violence: Not on file    Outpatient Encounter Medications as of 10/25/2022  Medication Sig   acetaminophen (TYLENOL) 500 MG tablet Take 1 tablet (500 mg total) by mouth every 6 (six) hours as needed.   ALPRAZolam (XANAX) 1 MG tablet Take 1 tablet (1 mg total) by mouth at bedtime as needed for anxiety.   Bepotastine Besilate 1.5 % SOLN    cefdinir (OMNICEF) 300 MG capsule Take 1 capsule (300 mg total) by mouth 2 (two) times daily for 10 days.   Cenegermin-bkbj (OXERVATE) 0.002 % SOLN Apply to eye.   Cholecalciferol (VITAMIN D) 2000 units CAPS Take by mouth.   cycloSPORINE (RESTASIS) 0.05 % ophthalmic emulsion 1 drop 2 (two) times daily.   fluorometholone (FML) 0.1 % ophthalmic suspension    fluticasone (FLONASE) 50 MCG/ACT nasal spray Place 2 sprays into both nostrils daily.   levocetirizine (XYZAL) 5 MG tablet Take 5 mg by mouth every evening.  MIEBO 1.338 GM/ML SOLN Apply 1 drop to eye 2 (two) times daily.   Potassium 99 MG TABS Take by mouth.   rosuvastatin (CRESTOR) 10 MG tablet Take 0.5 tablets (5 mg total) by mouth daily.   triamcinolone ointment (KENALOG) 0.5 % Apply 1 Application topically 2 (two) times daily.   TYRVAYA 0.03 MG/ACT SOLN Place 1 spray into both nostrils 2 (two) times daily.   UNABLE TO FIND Med Name: fluorouracil cream   vitamin B-12 (CYANOCOBALAMIN) 1000 MCG tablet Take 1,000 mcg by mouth daily.   vitamin E 400 UNIT capsule Take 400 Units by mouth daily.   Zinc Sulfate (ZINC 15 PO) Take by mouth.   No facility-administered encounter medications on file as of 10/25/2022.    Allergies  Allergen Reactions   Atorvastatin Other (See Comments)    Muscle aches   Carbamazepine Nausea And Vomiting     Chest tightness Chest tightness    Pregabalin Nausea And Vomiting   Augmentin [Amoxicillin-Pot Clavulanate] Diarrhea   Latex Dermatitis    Review of Systems  Constitutional:  Negative for activity change, appetite change, chills, diaphoresis, fatigue, fever and unexpected weight change.  HENT:  Positive for congestion, ear discharge, ear pain, postnasal drip, rhinorrhea and tinnitus. Negative for dental problem, drooling, facial swelling, hearing loss, mouth sores, nosebleeds, sinus pressure, sinus pain, sneezing, sore throat, trouble swallowing and voice change.   Eyes:  Negative for photophobia and visual disturbance.  Respiratory:  Positive for cough. Negative for apnea, choking, chest tightness, shortness of breath, wheezing and stridor.   Cardiovascular:  Negative for chest pain, palpitations and leg swelling.  Gastrointestinal:  Negative for abdominal pain, diarrhea and vomiting.  Endocrine: Negative for polydipsia, polyphagia and polyuria.  Genitourinary:  Negative for decreased urine volume and difficulty urinating.  Musculoskeletal:  Negative for neck pain.  Skin:  Negative for rash.  Neurological:  Negative for dizziness, tremors, seizures, syncope, facial asymmetry, speech difficulty, weakness, light-headedness, numbness and headaches.  Psychiatric/Behavioral:  Negative for confusion.   All other systems reviewed and are negative.       Objective:  BP (!) 141/74   Pulse 63   Temp (!) 97.3 F (36.3 C) (Temporal)   Ht '6\' 1"'$  (1.854 m)   Wt 186 lb 9.6 oz (84.6 kg)   SpO2 97%   BMI 24.62 kg/m    Wt Readings from Last 3 Encounters:  10/25/22 186 lb 9.6 oz (84.6 kg)  09/11/22 187 lb (84.8 kg)  06/11/22 184 lb 6.4 oz (83.6 kg)    Physical Exam Vitals and nursing note reviewed.  Constitutional:      General: He is not in acute distress.    Appearance: Normal appearance. He is well-developed and well-groomed. He is not ill-appearing, toxic-appearing or diaphoretic.   HENT:     Head:     Jaw: There is normal jaw occlusion.     Right Ear: Hearing normal. Drainage and tenderness present. Tympanic membrane is perforated and erythematous.     Left Ear: Hearing normal. A middle ear effusion is present.     Nose: Nose normal.     Mouth/Throat:     Lips: Pink.     Mouth: Mucous membranes are moist.     Pharynx: Oropharynx is clear. Uvula midline.  Eyes:     General: Lids are normal.     Extraocular Movements: Extraocular movements intact.     Conjunctiva/sclera: Conjunctivae normal.     Pupils: Pupils are equal, round, and reactive to light.  Comments: Right eye ptosis  Neck:     Thyroid: No thyroid mass, thyromegaly or thyroid tenderness.     Vascular: No carotid bruit or JVD.     Trachea: Trachea and phonation normal.  Cardiovascular:     Rate and Rhythm: Normal rate and regular rhythm.     Chest Wall: PMI is not displaced.     Pulses: Normal pulses.     Heart sounds: Normal heart sounds. No murmur heard.    No friction rub. No gallop.  Pulmonary:     Effort: Pulmonary effort is normal. No respiratory distress.     Breath sounds: Normal breath sounds. No wheezing.  Abdominal:     General: Bowel sounds are normal. There is no distension or abdominal bruit.     Palpations: Abdomen is soft. There is no hepatomegaly or splenomegaly.     Tenderness: There is no abdominal tenderness. There is no right CVA tenderness or left CVA tenderness.     Hernia: No hernia is present.  Musculoskeletal:        General: Normal range of motion.     Cervical back: Normal range of motion and neck supple.     Right lower leg: No edema.     Left lower leg: No edema.  Lymphadenopathy:     Cervical: No cervical adenopathy.  Skin:    General: Skin is warm and dry.     Capillary Refill: Capillary refill takes less than 2 seconds.     Coloration: Skin is not cyanotic, jaundiced or pale.     Findings: No rash.  Neurological:     General: No focal deficit present.      Mental Status: He is alert and oriented to person, place, and time.     Sensory: Sensation is intact.     Motor: Motor function is intact.     Coordination: Coordination is intact.     Gait: Gait is intact.     Deep Tendon Reflexes: Reflexes are normal and symmetric.  Psychiatric:        Attention and Perception: Attention and perception normal.        Mood and Affect: Mood and affect normal.        Speech: Speech normal.        Behavior: Behavior normal. Behavior is cooperative.        Thought Content: Thought content normal.        Cognition and Memory: Cognition and memory normal.        Judgment: Judgment normal.     Results for orders placed or performed in visit on 06/11/22  ToxASSURE Select 13 (MW), Urine  Result Value Ref Range   Summary Note   CMP14+EGFR  Result Value Ref Range   Glucose 88 70 - 99 mg/dL   BUN 13 8 - 27 mg/dL   Creatinine, Ser 1.01 0.76 - 1.27 mg/dL   eGFR 82 >59 mL/min/1.73   BUN/Creatinine Ratio 13 10 - 24   Sodium 141 134 - 144 mmol/L   Potassium 4.1 3.5 - 5.2 mmol/L   Chloride 102 96 - 106 mmol/L   CO2 25 20 - 29 mmol/L   Calcium 9.0 8.6 - 10.2 mg/dL   Total Protein 6.5 6.0 - 8.5 g/dL   Albumin 4.6 3.9 - 4.9 g/dL   Globulin, Total 1.9 1.5 - 4.5 g/dL   Albumin/Globulin Ratio 2.4 (H) 1.2 - 2.2   Bilirubin Total 0.5 0.0 - 1.2 mg/dL   Alkaline Phosphatase 67 44 -  121 IU/L   AST 12 0 - 40 IU/L   ALT 15 0 - 44 IU/L  CBC with Differential/Platelet  Result Value Ref Range   WBC 6.3 3.4 - 10.8 x10E3/uL   RBC 5.23 4.14 - 5.80 x10E6/uL   Hemoglobin 15.6 13.0 - 17.7 g/dL   Hematocrit 46.0 37.5 - 51.0 %   MCV 88 79 - 97 fL   MCH 29.8 26.6 - 33.0 pg   MCHC 33.9 31.5 - 35.7 g/dL   RDW 13.6 11.6 - 15.4 %   Platelets 191 150 - 450 x10E3/uL   Neutrophils 73 Not Estab. %   Lymphs 15 Not Estab. %   Monocytes 9 Not Estab. %   Eos 2 Not Estab. %   Basos 1 Not Estab. %   Neutrophils Absolute 4.7 1.4 - 7.0 x10E3/uL   Lymphocytes Absolute 0.9 0.7 -  3.1 x10E3/uL   Monocytes Absolute 0.6 0.1 - 0.9 x10E3/uL   EOS (ABSOLUTE) 0.1 0.0 - 0.4 x10E3/uL   Basophils Absolute 0.0 0.0 - 0.2 x10E3/uL   Immature Granulocytes 0 Not Estab. %   Immature Grans (Abs) 0.0 0.0 - 0.1 x10E3/uL  Lipid panel  Result Value Ref Range   Cholesterol, Total 271 (H) 100 - 199 mg/dL   Triglycerides 281 (H) 0 - 149 mg/dL   HDL 32 (L) >39 mg/dL   VLDL Cholesterol Cal 55 (H) 5 - 40 mg/dL   LDL Chol Calc (NIH) 184 (H) 0 - 99 mg/dL   Chol/HDL Ratio 8.5 (H) 0.0 - 5.0 ratio  PSA, total and free  Result Value Ref Range   Prostate Specific Ag, Serum 1.0 0.0 - 4.0 ng/mL   PSA, Free 0.61 N/A ng/mL   PSA, Free Pct 61.0 %  TSH  Result Value Ref Range   TSH 0.814 0.450 - 4.500 uIU/mL       Pertinent labs & imaging results that were available during my care of the patient were reviewed by me and considered in my medical decision making.  Assessment & Plan:  Generoso was seen today for nasal congestion and ear drainage.  Diagnoses and all orders for this visit:  Non-recurrent acute suppurative otitis media of right ear with spontaneous rupture of tympanic membrane AOM with TM rupture. Will treat with below. Aware to complete full course of antibiotics. If persistent or worsening, will refer back to ENT.  -     cefdinir (OMNICEF) 300 MG capsule; Take 1 capsule (300 mg total) by mouth 2 (two) times daily for 10 days.     Continue all other maintenance medications.  Follow up plan: Return in about 2 weeks (around 11/08/2022), or if symptoms worsen or fail to improve, for AOM.   Continue healthy lifestyle choices, including diet (rich in fruits, vegetables, and lean proteins, and low in salt and simple carbohydrates) and exercise (at least 30 minutes of moderate physical activity daily).  Educational handout given for eardrum rupture  The above assessment and management plan was discussed with the patient. The patient verbalized understanding of and has agreed to the  management plan. Patient is aware to call the clinic if they develop any new symptoms or if symptoms persist or worsen. Patient is aware when to return to the clinic for a follow-up visit. Patient educated on when it is appropriate to go to the emergency department.   Monia Pouch, FNP-C [facility] [phone]

## 2022-10-26 DIAGNOSIS — D329 Benign neoplasm of meninges, unspecified: Secondary | ICD-10-CM | POA: Diagnosis not present

## 2022-10-26 DIAGNOSIS — Z6825 Body mass index (BMI) 25.0-25.9, adult: Secondary | ICD-10-CM | POA: Diagnosis not present

## 2022-11-08 ENCOUNTER — Encounter: Payer: Self-pay | Admitting: Family

## 2022-11-08 ENCOUNTER — Ambulatory Visit: Payer: BC Managed Care – PPO | Admitting: Family

## 2022-11-08 VITALS — BP 132/78 | HR 64 | Temp 98.1°F | Ht 73.0 in | Wt 185.4 lb

## 2022-11-08 DIAGNOSIS — J329 Chronic sinusitis, unspecified: Secondary | ICD-10-CM | POA: Diagnosis not present

## 2022-11-08 DIAGNOSIS — H7211 Attic perforation of tympanic membrane, right ear: Secondary | ICD-10-CM | POA: Diagnosis not present

## 2022-11-08 NOTE — Patient Instructions (Signed)
Eardrum Rupture, Adult  An eardrum rupture is a hole (perforation) in the eardrum. The eardrum is a thin, round tissue inside of the ear that separates the ear canal from the middle ear. The eardrum is also called the tympanic membrane. It transfers sound vibrations through small bones in the middle ear to the hearing nerve in the inner ear. It also protects the middle ear from germs. An eardrum rupture can cause pain and hearing loss. What are the causes? This condition may be caused by: An infection. A sudden injury, such as from: Inserting a thin, sharp object into the ear. A hit to the side of the head, especially by an open hand. Falling onto water or a flat surface. A rapid change in pressure, such as from flying or scuba diving. A sudden increase in pressure against the eardrum, such as from an explosion or a very loud noise. Inserting a cotton-tipped swab in the ear. A long-term eustachian tube disorder. Eustachian tubes are parts of the body that connect each middle ear space to the back of the nose. A medical procedure or surgery, such as a procedure to remove wax from the ear canal. Removing a pressure equalization tube(PE tube) that was surgically placed through the eardrum. Having a PE tube fall out. What increases the risk? You are more likely to develop this condition if: You have had PE tubes inserted in your ears. You have an ear infection. You play sports that: Involve balls or contact with other players. Take place in water, such as diving, scuba diving, or waterskiing. What are the signs or symptoms? Symptoms of this condition include: Sudden pain at the time of the injury. Ear pain that suddenly improves. Ringing in the ear after the injury. Drainage from the ear. The drainage may be clear, cloudy or pus-like, or bloody. Hearing loss. Dizziness. How is this diagnosed? This condition is diagnosed based on your symptoms and medical history as well as a physical  exam. Your health care provider can usually see a perforation using an ear scope (otoscope). You may have tests, such as: A hearing test (audiogram) to check for hearing loss. A test in which a sample of ear drainage is tested for infection (culture). How is this treated? An eardrum typically heals on its own within a few weeks. If your eardrum does not heal, your health care provider may recommend a procedure to place a patch over your eardrum or surgery to repair your eardrum. Your health care provider may also prescribe antibiotic medicines to help prevent infection. If the ear heals completely, any hearing loss should be temporary. Follow these instructions at home: Medicines Take over-the-counter and prescription medicines only as told by your health care provider. If you were prescribed an antibiotic medicine, use it as told by your health care provider. Do not stop using the antibiotic even if you start to feel better. Ear care Keep your ear dry. This is very important. Follow instructions from your health care provider about how to keep your ear dry. You may need to wear waterproof earplugs when bathing and swimming. If directed, apply heat to your affected ear as often as told by your health care provider. Use the heat source that your health care provider recommends, such as a moist heat pack or a heating pad. This will help to relieve pain. Place a towel between your skin and the heat source. Leave the heat on for 20-30 minutes. Remove the heat if your skin turns bright red.   This is especially important if you are unable to feel pain, heat, or cold. You have a greater risk of getting burned. General instructions Return to sports and activities as told by your health care provider. Ask your health care provider what activities are safe for you. Wear headgear with ear protection when you play sports in which ear injuries are common. Talk to your health care provider before traveling by  plane. Keep all follow-up visits. This is important. Contact a health care provider if: You have a fever. You have ear pain. You have mucus or blood draining from your ear. You have hearing loss, dizziness, or ringing in your ear. Get help right away if: You have sudden hearing loss. You are very dizzy. You have severe ear pain. Your face feels weak or becomes limp (paralyzed). These symptoms may represent a serious problem that is an emergency. Do not wait to see if the symptoms will go away. Get medical help right away. Call your local emergency services (911 in the [place] Do not drive yourself to the hospital. Summary An eardrum rupture is a hole (perforation) in the eardrum that can cause pain and hearing loss. It is usually caused by a sudden injury to the ear. The eardrum will likely heal on its own within a few weeks. In some cases, surgery may be necessary. Follow instructions from your health care provider about how to keep your ear dry as it heals. This information is not intended to replace advice given to you by your health care provider. Make sure you discuss any questions you have with your health care provider. Document Revised: 07/04/2020 Document Reviewed: 07/04/2020 Elsevier Patient Education  2023 [employer]  

## 2022-11-08 NOTE — Progress Notes (Signed)
Subjective:    Patient ID: Frank Hayden, male    DOB: [birth year], 67 y.o.   MRN: [id number]  Chief Complaint  Patient presents with   Follow-up    Return in about 2 weeks (around 11/08/2022), or if symptoms worsen or fail to improve, for AOM.    PT presents to the office today to follow up on AOM. He was seen on 10/25/22 and given cefdinir 300 mg BID for 10 days. He completed this and reports the drainage has resolved. Reports mild intermittent aching pain 4 out 10.   He has chronic sinusitis and has not seen ENT in years.  Otalgia  There is pain in the right ear. This is a recurrent problem. The current episode started 1 to 4 weeks ago. There has been no fever. The pain is mild. Pertinent negatives include no coughing, ear discharge, headaches, hearing loss (no new), rhinorrhea or sore throat. He has tried antibiotics and acetaminophen for the symptoms. The treatment provided moderate relief.      Review of Systems  HENT:  Positive for ear pain. Negative for ear discharge, hearing loss (no new), rhinorrhea and sore throat.   Respiratory:  Negative for cough.   Neurological:  Negative for headaches.  All other systems reviewed and are negative.      Objective:   Physical Exam Vitals reviewed.  Constitutional:      General: He is not in acute distress.    Appearance: He is well-developed.  HENT:     Head: Normocephalic.     Right Ear: Tympanic membrane is perforated.     Left Ear: Tympanic membrane normal.  Eyes:     General:        Right eye: No discharge.        Left eye: No discharge.     Pupils: Pupils are equal, round, and reactive to light.  Neck:     Thyroid: No thyromegaly.  Cardiovascular:     Rate and Rhythm: Normal rate and regular rhythm.     Heart sounds: Normal heart sounds. No murmur heard. Pulmonary:     Effort: Pulmonary effort is normal. No respiratory distress.     Breath sounds: Normal breath sounds. No wheezing.  Abdominal:     General: Bowel  sounds are normal. There is no distension.     Palpations: Abdomen is soft.     Tenderness: There is no abdominal tenderness.  Musculoskeletal:        General: No tenderness. Normal range of motion.     Cervical back: Normal range of motion and neck supple.  Skin:    General: Skin is warm and dry.     Findings: No erythema or rash.  Neurological:     Mental Status: He is alert and oriented to person, place, and time.     Cranial Nerves: No cranial nerve deficit.     Deep Tendon Reflexes: Reflexes are normal and symmetric.  Psychiatric:        Behavior: Behavior normal.        Thought Content: Thought content normal.        Judgment: Judgment normal.       BP 132/78   Pulse 64   Temp 98.1 F (36.7 C) (Temporal)   Ht '6\' 1"'$  (1.854 m)   Wt 185 lb 6.4 oz (84.1 kg)   SpO2 94%   BMI 24.46 kg/m      Assessment & Plan:  Frank Hayden comes in today  with chief complaint of Follow-up (Return in about 2 weeks (around 11/08/2022), or if symptoms worsen or fail to improve, for AOM./)   Diagnosis and orders addressed:  1. Attic perforation of tympanic membrane, right ear Avoid getting water into ear Report any fevers or increased pain - Ambulatory referral to ENT  2. Chronic sinusitis, unspecified location - Ambulatory referral to ENT    Evelina Dun, FNP

## 2022-12-05 DIAGNOSIS — D225 Melanocytic nevi of trunk: Secondary | ICD-10-CM | POA: Diagnosis not present

## 2022-12-05 DIAGNOSIS — L578 Other skin changes due to chronic exposure to nonionizing radiation: Secondary | ICD-10-CM | POA: Diagnosis not present

## 2022-12-05 DIAGNOSIS — L570 Actinic keratosis: Secondary | ICD-10-CM | POA: Diagnosis not present

## 2022-12-05 DIAGNOSIS — L814 Other melanin hyperpigmentation: Secondary | ICD-10-CM | POA: Diagnosis not present

## 2022-12-05 DIAGNOSIS — L821 Other seborrheic keratosis: Secondary | ICD-10-CM | POA: Diagnosis not present

## 2022-12-07 DIAGNOSIS — H903 Sensorineural hearing loss, bilateral: Secondary | ICD-10-CM | POA: Diagnosis not present

## 2022-12-07 DIAGNOSIS — H6121 Impacted cerumen, right ear: Secondary | ICD-10-CM | POA: Diagnosis not present

## 2022-12-07 DIAGNOSIS — H7291 Unspecified perforation of tympanic membrane, right ear: Secondary | ICD-10-CM | POA: Diagnosis not present

## 2022-12-07 DIAGNOSIS — H7211 Attic perforation of tympanic membrane, right ear: Secondary | ICD-10-CM | POA: Diagnosis not present

## 2022-12-11 ENCOUNTER — Encounter: Payer: Self-pay | Admitting: Family

## 2022-12-11 ENCOUNTER — Ambulatory Visit: Payer: BC Managed Care – PPO | Admitting: Family

## 2022-12-11 VITALS — BP 130/82 | HR 59 | Temp 97.3°F | Ht 73.0 in | Wt 184.2 lb

## 2022-12-11 DIAGNOSIS — F411 Generalized anxiety disorder: Secondary | ICD-10-CM | POA: Diagnosis not present

## 2022-12-11 DIAGNOSIS — F5101 Primary insomnia: Secondary | ICD-10-CM

## 2022-12-11 DIAGNOSIS — E785 Hyperlipidemia, unspecified: Secondary | ICD-10-CM

## 2022-12-11 DIAGNOSIS — J329 Chronic sinusitis, unspecified: Secondary | ICD-10-CM | POA: Diagnosis not present

## 2022-12-11 DIAGNOSIS — F1320 Sedative, hypnotic or anxiolytic dependence, uncomplicated: Secondary | ICD-10-CM

## 2022-12-11 DIAGNOSIS — Z79899 Other long term (current) drug therapy: Secondary | ICD-10-CM

## 2022-12-11 DIAGNOSIS — D329 Benign neoplasm of meninges, unspecified: Secondary | ICD-10-CM

## 2022-12-11 DIAGNOSIS — M791 Myalgia, unspecified site: Secondary | ICD-10-CM

## 2022-12-11 DIAGNOSIS — T466X5A Adverse effect of antihyperlipidemic and antiarteriosclerotic drugs, initial encounter: Secondary | ICD-10-CM

## 2022-12-11 MED ORDER — ROSUVASTATIN CALCIUM 10 MG PO TABS: 5.0000 mg | ORAL_TABLET | Freq: Every day | ORAL | 3 refills | Status: DC

## 2022-12-11 MED ORDER — ALPRAZOLAM 1 MG PO TABS: 1.0000 mg | ORAL_TABLET | Freq: Every evening | ORAL | 2 refills | Status: DC | PRN

## 2022-12-11 NOTE — Progress Notes (Signed)
Subjective:    Patient ID: Frank Hayden, male    DOB: 11/14/1955, 67 y.o.   MRN: [id number]  Chief Complaint  Patient presents with   Medical Management of Chronic Issues   Pt presents to the office today for chronic follow up. He had a meningioma partially removed in 02/1999 that involved is right sinus cavity, optic nerve, and carotid artery. He completed radiation. He is followed by a Neurosurgeon annually to continue to monitor and has another meningioma.  He takes 1 mg Xanax to help with nerve pain on side of face.    He is followed by Dermatologists every 6 months. He is followed by Ophthalmologist every 4 months.  He is followed by ENT for chronic sinusitis and attic perforation of TM.  Insomnia Primary symptoms: difficulty falling asleep, frequent awakening.   The current episode started more than one year. The onset quality is gradual. The problem occurs intermittently. Past treatments include medication. The treatment provided moderate relief.  Anxiety Presents for follow-up visit. Symptoms include depressed mood, excessive worry, insomnia, irritability and nervous/anxious behavior. Symptoms occur most days. The severity of symptoms is moderate.        Review of Systems  Constitutional:  Positive for irritability.  Psychiatric/Behavioral:  The patient is nervous/anxious and has insomnia.   All other systems reviewed and are negative.      Objective:   Physical Exam Vitals reviewed.  Constitutional:      General: He is not in acute distress.    Appearance: He is well-developed.  HENT:     Head: Normocephalic.     Right Ear: External ear normal.     Left Ear: External ear normal.  Eyes:     General:        Right eye: No discharge.        Left eye: No discharge.     Pupils: Pupils are equal, round, and reactive to light.  Neck:     Thyroid: No thyromegaly.  Cardiovascular:     Rate and Rhythm: Normal rate and regular rhythm.     Heart sounds: Normal heart  sounds. No murmur heard. Pulmonary:     Effort: Pulmonary effort is normal. No respiratory distress.     Breath sounds: Normal breath sounds. No wheezing.  Abdominal:     General: Bowel sounds are normal. There is no distension.     Palpations: Abdomen is soft.     Tenderness: There is abdominal tenderness in the periumbilical area.  Musculoskeletal:        General: No tenderness. Normal range of motion.     Cervical back: Normal range of motion and neck supple.  Skin:    General: Skin is warm and dry.     Findings: No erythema or rash.  Neurological:     Mental Status: He is alert and oriented to person, place, and time.     Cranial Nerves: No cranial nerve deficit.     Deep Tendon Reflexes: Reflexes are normal and symmetric.  Psychiatric:        Behavior: Behavior normal.        Thought Content: Thought content normal.        Judgment: Judgment normal.      BP 130/82   Pulse (!) 59   Temp (!) 97.3 F (36.3 C) (Temporal)   Ht  (1.854 m)   Wt 184 lb 3.2 oz (83.6 kg)   SpO2 100%   BMI 24.30 kg/m  Assessment & Plan:  Frank Hayden comes in today with chief complaint of Medical Management of Chronic Issues   Diagnosis and orders addressed:  1. Primary insomnia - ALPRAZolam (XANAX) 1 MG tablet; Take 1 tablet (1 mg total) by mouth at bedtime as needed for anxiety.  Dispense: 30 tablet; Refill: 2  2. Controlled substance agreement signed - ALPRAZolam (XANAX) 1 MG tablet; Take 1 tablet (1 mg total) by mouth at bedtime as needed for anxiety.  Dispense: 30 tablet; Refill: 2  3. Chronic sinusitis, unspecified location  4. Benzodiazepine dependence  5. GAD (generalized anxiety disorder)  6. Meningioma of right sphenoid wing involving cavernous sinus  7. Myalgia due to statin  8. Hyperlipidemia, unspecified hyperlipidemia type - rosuvastatin (CRESTOR) 10 MG tablet; Take 0.5 tablets (5 mg total) by mouth daily.  Dispense: 90 tablet; Refill: 3   Labs  pending Patient reviewed in [place] controlled database, no flags noted. Contract and drug screen are up to date.  Health Maintenance reviewed Diet and exercise encouraged  Follow up plan: 3 months    [name], FNP

## 2022-12-11 NOTE — Patient Instructions (Signed)
Health Maintenance After Age 68 After age 68, you are at a higher risk for certain long-term diseases and infections as well as injuries from falls. Falls are a major cause of broken bones and head injuries in people who are older than age 68. Getting regular preventive care can help to keep you healthy and well. Preventive care includes getting regular testing and making lifestyle changes as recommended by your health care provider. Talk with your health care provider about: Which screenings and tests you should have. A screening is a test that checks for a disease when you have no symptoms. A diet and exercise plan that is right for you. What should I know about screenings and tests to prevent falls? Screening and testing are the best ways to find a health problem early. Early diagnosis and treatment give you the best chance of managing medical conditions that are common after age 68. Certain conditions and lifestyle choices may make you more likely to have a fall. Your health care provider may recommend: Regular vision checks. Poor vision and conditions such as cataracts can make you more likely to have a fall. If you wear glasses, make sure to get your prescription updated if your vision changes. Medicine review. Work with your health care provider to regularly review all of the medicines you are taking, including over-the-counter medicines. Ask your health care provider about any side effects that may make you more likely to have a fall. Tell your health care provider if any medicines that you take make you feel dizzy or sleepy. Strength and balance checks. Your health care provider may recommend certain tests to check your strength and balance while standing, walking, or changing positions. Foot health exam. Foot pain and numbness, as well as not wearing proper footwear, can make you more likely to have a fall. Screenings, including: Osteoporosis screening. Osteoporosis is a condition that causes  the bones to get weaker and break more easily. Blood pressure screening. Blood pressure changes and medicines to control blood pressure can make you feel dizzy. Depression screening. You may be more likely to have a fall if you have a fear of falling, feel depressed, or feel unable to do activities that you used to do. Alcohol use screening. Using too much alcohol can affect your balance and may make you more likely to have a fall. Follow these instructions at home: Lifestyle Do not drink alcohol if: Your health care provider tells you not to drink. If you drink alcohol: Limit how much you have to: 0-1 drink a day for women. 0-2 drinks a day for men. Know how much alcohol is in your drink. In the [place] one drink equals one 12 oz bottle of beer (355 mL), one 5 oz glass of wine (148 mL), or one 1 oz glass of hard liquor (44 mL). Do not use any products that contain nicotine or tobacco. These products include cigarettes, chewing tobacco, and vaping devices, such as e-cigarettes. If you need help quitting, ask your health care provider. Activity  Follow a regular exercise program to stay fit. This will help you maintain your balance. Ask your health care provider what types of exercise are appropriate for you. If you need a cane or walker, use it as recommended by your health care provider. Wear supportive shoes that have nonskid soles. Safety  Remove any tripping hazards, such as rugs, cords, and clutter. Install safety equipment such as grab bars in bathrooms and safety rails on stairs. Keep rooms and walkways   well-lit. General instructions Talk with your health care provider about your risks for falling. Tell your health care provider if: You fall. Be sure to tell your health care provider about all falls, even ones that seem minor. You feel dizzy, tiredness (fatigue), or off-balance. Take over-the-counter and prescription medicines only as told by your health care provider. These include  supplements. Eat a healthy diet and maintain a healthy weight. A healthy diet includes low-fat dairy products, low-fat (lean) meats, and fiber from whole grains, beans, and lots of fruits and vegetables. Stay current with your vaccines. Schedule regular health, dental, and eye exams. Summary Having a healthy lifestyle and getting preventive care can help to protect your health and wellness after age 68. Screening and testing are the best way to find a health problem early and help you avoid having a fall. Early diagnosis and treatment give you the best chance for managing medical conditions that are more common for people who are older than age 68. Falls are a major cause of broken bones and head injuries in people who are older than age 68. Take precautions to prevent a fall at home. Work with your health care provider to learn what changes you can make to improve your health and wellness and to prevent falls. This information is not intended to replace advice given to you by your health care provider. Make sure you discuss any questions you have with your health care provider. Document Revised: 01/02/2021 Document Reviewed: 01/02/2021 Elsevier Patient Education  2023 Elsevier Inc[employer]  

## 2022-12-28 ENCOUNTER — Telehealth: Payer: BC Managed Care – PPO | Admitting: Family Medicine

## 2022-12-28 ENCOUNTER — Encounter: Payer: Self-pay | Admitting: Family Medicine

## 2022-12-28 DIAGNOSIS — J321 Chronic frontal sinusitis: Secondary | ICD-10-CM | POA: Diagnosis not present

## 2022-12-28 MED ORDER — BENZONATATE 200 MG PO CAPS
200.0000 mg | ORAL_CAPSULE | Freq: Two times a day (BID) | ORAL | 1 refills | Status: DC | PRN
Start: 2022-12-28 — End: 2023-05-03

## 2022-12-28 MED ORDER — CEFDINIR 300 MG PO CAPS: 300.0000 mg | ORAL_CAPSULE | Freq: Two times a day (BID) | ORAL | 0 refills | Status: DC

## 2022-12-28 NOTE — Progress Notes (Signed)
Virtual Visit via MyChart video note  I connected with Frank Hayden on 12/28/22 at 1300 by video and verified that I am speaking with the correct person using two identifiers. Frank Hayden is currently located at home and  wife Frank Hayden  are currently with her during visit. The provider, Elige Radon Hilman Kissling, MD is located in their office at time of visit.  Call ended at 1321  I discussed the limitations, risks, security and privacy concerns of performing an evaluation and management service by video and the availability of in person appointments. I also discussed with the patient that there may be a patient responsible charge related to this service. The patient expressed understanding and agreed to proceed.   History and Present Illness: Patient is calling in today for cough and congestion. Worked outside for a couple days.  He has a sinus headache and nose stopped up.  He has post nasal drainage.  He has productive cough.  He had fever 3 days ago of 100.9.  no fever since. He has taken mucinex and nyquil and tylenol and xyzal and they are not helping.  He has a runny nose today. He denies sick contacts that they know of. He had asthma as a child but none since. He does get recurrent allergies and sometimes gets sinus infections.   1. Chronic frontal sinusitis     Outpatient Encounter Medications as of 12/28/2022  Medication Sig   benzonatate (TESSALON) 200 MG capsule Take 1 capsule (200 mg total) by mouth 2 (two) times daily as needed for cough.   cefdinir (OMNICEF) 300 MG capsule Take 1 capsule (300 mg total) by mouth 2 (two) times daily. 1 po BID   acetaminophen (TYLENOL) 500 MG tablet Take 1 tablet (500 mg total) by mouth every 6 (six) hours as needed.   ALPRAZolam (XANAX) 1 MG tablet Take 1 tablet (1 mg total) by mouth at bedtime as needed for anxiety.   Bepotastine Besilate 1.5 % SOLN    Cenegermin-bkbj (OXERVATE) 0.002 % SOLN Apply to eye.   Cholecalciferol (VITAMIN D) 2000 units  CAPS Take by mouth.   cycloSPORINE (RESTASIS) 0.05 % ophthalmic emulsion 1 drop 2 (two) times daily.   fluorometholone (FML) 0.1 % ophthalmic suspension    fluticasone (FLONASE) 50 MCG/ACT nasal spray Place 2 sprays into both nostrils daily.   levocetirizine (XYZAL) 5 MG tablet Take 5 mg by mouth every evening.   MIEBO 1.338 GM/ML SOLN Apply 1 drop to eye 2 (two) times daily.   Potassium 99 MG TABS Take by mouth.   rosuvastatin (CRESTOR) 10 MG tablet Take 0.5 tablets (5 mg total) by mouth daily.   triamcinolone ointment (KENALOG) 0.5 % Apply 1 Application topically 2 (two) times daily.   TYRVAYA 0.03 MG/ACT SOLN Place 1 spray into both nostrils 2 (two) times daily.   UNABLE TO FIND Med Name: fluorouracil cream   vitamin B-12 (CYANOCOBALAMIN) 1000 MCG tablet Take 1,000 mcg by mouth daily.   vitamin E 400 UNIT capsule Take 400 Units by mouth daily.   Zinc Sulfate (ZINC 15 PO) Take by mouth.   No facility-administered encounter medications on file as of 12/28/2022.    Review of Systems  Constitutional:  Positive for fever. Negative for chills.  HENT:  Positive for congestion, postnasal drip, rhinorrhea, sinus pressure and sneezing. Negative for ear discharge, ear pain, sore throat and voice change.   Eyes:  Negative for pain, discharge, redness and visual disturbance.  Respiratory:  Positive for  cough. Negative for shortness of breath and wheezing.   Cardiovascular:  Negative for chest pain and leg swelling.  Musculoskeletal:  Negative for gait problem.  Skin:  Negative for rash.  All other systems reviewed and are negative.   Observations/Objective: Patient sounds comfortable, is coughing but otherwise in no acute distress.  Assessment and Plan: Problem List Items Addressed This Visit       Respiratory   Chronic sinusitis - Primary   Relevant Medications   cefdinir (OMNICEF) 300 MG capsule   benzonatate (TESSALON) 200 MG capsule    Send cefdinir and Tessalon Perles and continue  with his Mucinex and Flonase and allergy medicine. If not improving or if worsens then please call us back Follow up plan: Return if symptoms worsen or fail to improve.     I discussed the assessment and treatment plan with the patient. The patient was provided an opportunity to ask questions and all were answered. The patient agreed with the plan and demonstrated an understanding of the instructions.   The patient was advised to call back or seek an in-person evaluation if the symptoms worsen or if the condition fails to improve as anticipated.  The above assessment and management plan was discussed with the patient. The patient verbalized understanding of and has agreed to the management plan. Patient is aware to call the clinic if symptoms persist or worsen. Patient is aware when to return to the clinic for a follow-up visit. Patient educated on when it is appropriate to go to the emergency department.    I provided 21 minutes of non-face-to-face time during this encounter.    Nils Pyle, MD

## 2022-12-31 ENCOUNTER — Ambulatory Visit (INDEPENDENT_AMBULATORY_CARE_PROVIDER_SITE_OTHER): Payer: BC Managed Care – PPO | Admitting: Family

## 2022-12-31 ENCOUNTER — Encounter: Payer: Self-pay | Admitting: Family

## 2022-12-31 ENCOUNTER — Ambulatory Visit (INDEPENDENT_AMBULATORY_CARE_PROVIDER_SITE_OTHER): Payer: BC Managed Care – PPO

## 2022-12-31 ENCOUNTER — Telehealth: Payer: Self-pay | Admitting: Family

## 2022-12-31 VITALS — BP 136/74 | HR 80 | Temp 97.1°F | Ht 73.0 in | Wt 178.0 lb

## 2022-12-31 DIAGNOSIS — R051 Acute cough: Secondary | ICD-10-CM

## 2022-12-31 DIAGNOSIS — J9811 Atelectasis: Secondary | ICD-10-CM | POA: Diagnosis not present

## 2022-12-31 DIAGNOSIS — B9689 Other specified bacterial agents as the cause of diseases classified elsewhere: Secondary | ICD-10-CM | POA: Diagnosis not present

## 2022-12-31 DIAGNOSIS — J208 Acute bronchitis due to other specified organisms: Secondary | ICD-10-CM

## 2022-12-31 DIAGNOSIS — R059 Cough, unspecified: Secondary | ICD-10-CM | POA: Diagnosis not present

## 2022-12-31 DIAGNOSIS — R0602 Shortness of breath: Secondary | ICD-10-CM

## 2022-12-31 MED ORDER — PREDNISONE 10 MG (21) PO TBPK
ORAL_TABLET | ORAL | 0 refills | Status: DC
Start: 2022-12-31 — End: 2023-03-20

## 2022-12-31 MED ORDER — ALBUTEROL SULFATE HFA 108 (90 BASE) MCG/ACT IN AERS
2.0000 | INHALATION_SPRAY | Freq: Four times a day (QID) | RESPIRATORY_TRACT | 0 refills | Status: DC | PRN
Start: 2022-12-31 — End: 2023-03-20

## 2022-12-31 NOTE — Progress Notes (Signed)
Subjective:    Patient ID: ASSER DAIL, male    DOB: [birth year], 67 y.o.   MRN: [id number]  Chief Complaint  Patient presents with   Cough   PT presents to the office today with a cough that started last week. He had a video visit on 12/28/22 and was given cefdinir and tessalon without relief.  Cough This is a new problem. The current episode started 1 to 4 weeks ago. The problem has been gradually worsening. The problem occurs every few minutes. The cough is Productive of sputum. Associated symptoms include headaches, nasal congestion, postnasal drip, a sore throat and shortness of breath. Pertinent negatives include no chills, ear congestion, ear pain, fever or wheezing.      Review of Systems  Constitutional:  Negative for chills and fever.  HENT:  Positive for postnasal drip and sore throat. Negative for ear pain.   Respiratory:  Positive for cough and shortness of breath. Negative for wheezing.   Neurological:  Positive for headaches.  All other systems reviewed and are negative.      Objective:   Physical Exam Vitals reviewed.  Constitutional:      General: He is not in acute distress.    Appearance: He is well-developed.  HENT:     Head: Normocephalic.  Eyes:     General:        Right eye: No discharge.        Left eye: No discharge.     Pupils: Pupils are equal, round, and reactive to light.  Neck:     Thyroid: No thyromegaly.  Cardiovascular:     Rate and Rhythm: Normal rate and regular rhythm.     Heart sounds: Normal heart sounds. No murmur heard. Pulmonary:     Effort: Pulmonary effort is normal. No respiratory distress.     Breath sounds: Wheezing and rhonchi present.  Abdominal:     General: Bowel sounds are normal. There is no distension.     Palpations: Abdomen is soft.     Tenderness: There is no abdominal tenderness.  Musculoskeletal:        General: No tenderness. Normal range of motion.     Cervical back: Normal range of motion and neck  supple.  Skin:    General: Skin is warm and dry.     Findings: No erythema or rash.  Neurological:     Mental Status: He is alert and oriented to person, place, and time.     Cranial Nerves: No cranial nerve deficit.     Deep Tendon Reflexes: Reflexes are normal and symmetric.  Psychiatric:        Behavior: Behavior normal.        Thought Content: Thought content normal.        Judgment: Judgment normal.          BP 136/74   Pulse 80   Temp (!) 97.1 F (36.2 C) (Temporal)   Ht 6\' 1"  (1.854 m)   Wt 178 lb (80.7 kg)   SpO2 97%   BMI 23.48 kg/m   Assessment & Plan:   Frank Hayden comes in today with chief complaint of Cough   Diagnosis and orders addressed:  1. Acute bacterial bronchitis  - predniSONE (STERAPRED UNI-PAK 21 TAB) 10 MG (21) TBPK tablet; Use as directed  Dispense: 21 tablet; Refill: 0 - albuterol (VENTOLIN HFA) 108 (90 Base) MCG/ACT inhaler; Inhale 2 puffs into the lungs every 6 (six) hours as needed for wheezing  or shortness of breath.  Dispense: 8 g; Refill: 0  2. Acute cough - DG Chest 2 View  3. SOB (shortness of breath) - DG Chest 2 View   - Take meds as prescribed -Start prednisone and albuterol as needed - If x-ray positive will change antibiotic to doxycyline  - Use a cool mist humidifier  -Use saline nose sprays frequently -Force fluids -For any cough or congestion  Use plain Mucinex- regular strength or max strength is fine -For fever or aces or pains- take tylenol or ibuprofen. -Throat lozenges if help -New toothbrush in 3 days   [name], FNP

## 2022-12-31 NOTE — Telephone Encounter (Signed)
Pts wife called stating that pt recently had a video visit with Dr Museum/gallery exhibitions officer. Wants pt to be seen in person because she is afraid he has pneumonia. Made him an appt for tomorrow with DOD. Wife wants to know if he can be worked in today?

## 2022-12-31 NOTE — Telephone Encounter (Signed)
Appt made with hawks wife aware

## 2022-12-31 NOTE — Patient Instructions (Signed)

## 2023-01-01 ENCOUNTER — Ambulatory Visit: Payer: BC Managed Care – PPO

## 2023-01-04 ENCOUNTER — Encounter (HOSPITAL_COMMUNITY): Payer: Self-pay | Admitting: Emergency Medicine

## 2023-01-04 ENCOUNTER — Observation Stay (HOSPITAL_COMMUNITY)
Admission: EM | Admit: 2023-01-04 | Discharge: 2023-01-06 | Disposition: A | Discharge status: Home / Self Care | Payer: BC Managed Care – PPO | Attending: Internal Medicine | Admitting: Internal Medicine

## 2023-01-04 ENCOUNTER — Emergency Department (HOSPITAL_COMMUNITY): Payer: BC Managed Care – PPO

## 2023-01-04 ENCOUNTER — Other Ambulatory Visit: Payer: Self-pay

## 2023-01-04 DIAGNOSIS — G459 Transient cerebral ischemic attack, unspecified: Secondary | ICD-10-CM | POA: Diagnosis not present

## 2023-01-04 DIAGNOSIS — R29810 Facial weakness: Secondary | ICD-10-CM | POA: Diagnosis not present

## 2023-01-04 DIAGNOSIS — Z91040 Latex allergy status: Secondary | ICD-10-CM | POA: Diagnosis not present

## 2023-01-04 DIAGNOSIS — E785 Hyperlipidemia, unspecified: Secondary | ICD-10-CM | POA: Diagnosis not present

## 2023-01-04 DIAGNOSIS — Z87891 Personal history of nicotine dependence: Secondary | ICD-10-CM | POA: Insufficient documentation

## 2023-01-04 DIAGNOSIS — D320 Benign neoplasm of cerebral meninges: Secondary | ICD-10-CM | POA: Diagnosis not present

## 2023-01-04 DIAGNOSIS — I63511 Cerebral infarction due to unspecified occlusion or stenosis of right middle cerebral artery: Secondary | ICD-10-CM | POA: Diagnosis not present

## 2023-01-04 DIAGNOSIS — R519 Headache, unspecified: Secondary | ICD-10-CM | POA: Diagnosis not present

## 2023-01-04 DIAGNOSIS — Z7952 Long term (current) use of systemic steroids: Secondary | ICD-10-CM | POA: Diagnosis not present

## 2023-01-04 DIAGNOSIS — R531 Weakness: Secondary | ICD-10-CM | POA: Diagnosis not present

## 2023-01-04 DIAGNOSIS — J019 Acute sinusitis, unspecified: Secondary | ICD-10-CM | POA: Diagnosis not present

## 2023-01-04 DIAGNOSIS — D329 Benign neoplasm of meninges, unspecified: Secondary | ICD-10-CM | POA: Diagnosis present

## 2023-01-04 DIAGNOSIS — R29700 NIHSS score 0: Secondary | ICD-10-CM | POA: Diagnosis not present

## 2023-01-04 DIAGNOSIS — Z79899 Other long term (current) drug therapy: Secondary | ICD-10-CM | POA: Diagnosis not present

## 2023-01-04 DIAGNOSIS — D72829 Elevated white blood cell count, unspecified: Secondary | ICD-10-CM | POA: Diagnosis not present

## 2023-01-04 DIAGNOSIS — Z85841 Personal history of malignant neoplasm of brain: Secondary | ICD-10-CM | POA: Insufficient documentation

## 2023-01-04 DIAGNOSIS — F411 Generalized anxiety disorder: Secondary | ICD-10-CM | POA: Diagnosis present

## 2023-01-04 DIAGNOSIS — J329 Chronic sinusitis, unspecified: Secondary | ICD-10-CM | POA: Diagnosis present

## 2023-01-04 DIAGNOSIS — R0602 Shortness of breath: Secondary | ICD-10-CM | POA: Diagnosis not present

## 2023-01-04 LAB — CBC WITH DIFFERENTIAL/PLATELET
Abs Immature Granulocytes: 0.67 10*3/uL — ABNORMAL HIGH (ref 0.00–0.07)
Basophils Absolute: 0 10*3/uL (ref 0.0–0.1)
Basophils Relative: 0 %
Eosinophils Absolute: 0 10*3/uL (ref 0.0–0.5)
Eosinophils Relative: 0 %
HCT: 41.4 % (ref 39.0–52.0)
Hemoglobin: 14.2 g/dL (ref 13.0–17.0)
Immature Granulocytes: 4 %
Lymphocytes Relative: 4 %
Lymphs Abs: 0.8 10*3/uL (ref 0.7–4.0)
MCH: 29.9 pg (ref 26.0–34.0)
MCHC: 34.3 g/dL (ref 30.0–36.0)
MCV: 87.2 fL (ref 80.0–100.0)
Monocytes Absolute: 0.7 10*3/uL (ref 0.1–1.0)
Monocytes Relative: 4 %
Neutro Abs: 15.8 10*3/uL — ABNORMAL HIGH (ref 1.7–7.7)
Neutrophils Relative %: 88 %
Platelets: 294 10*3/uL (ref 150–400)
RBC: 4.75 MIL/uL (ref 4.22–5.81)
RDW: 13.2 % (ref 11.5–15.5)
WBC: 17.9 10*3/uL — ABNORMAL HIGH (ref 4.0–10.5)
nRBC: 0 % (ref 0.0–0.2)

## 2023-01-04 LAB — COMPREHENSIVE METABOLIC PANEL
ALT: 105 U/L — ABNORMAL HIGH (ref 0–44)
AST: 37 U/L (ref 15–41)
Albumin: 3.4 g/dL — ABNORMAL LOW (ref 3.5–5.0)
Alkaline Phosphatase: 117 U/L (ref 38–126)
Anion gap: 10 (ref 5–15)
BUN: 19 mg/dL (ref 8–23)
CO2: 23 mmol/L (ref 22–32)
Calcium: 8.7 mg/dL — ABNORMAL LOW (ref 8.9–10.3)
Chloride: 102 mmol/L (ref 98–111)
Creatinine, Ser: 1.05 mg/dL (ref 0.61–1.24)
GFR, Estimated: >60 mL/min (ref >60)
Glucose, Bld: 121 mg/dL — ABNORMAL HIGH (ref 70–99)
Potassium: 4 mmol/L (ref 3.5–5.1)
Sodium: 135 mmol/L (ref 135–145)
Total Bilirubin: 0.5 mg/dL (ref 0.3–1.2)
Total Protein: 7 g/dL (ref 6.5–8.1)

## 2023-01-04 LAB — RESPIRATORY PANEL BY PCR

## 2023-01-04 LAB — CBG MONITORING, ED: Glucose-Capillary: 104 mg/dL — ABNORMAL HIGH (ref 70–99)

## 2023-01-04 MED ORDER — SENNOSIDES-DOCUSATE SODIUM 8.6-50 MG PO TABS: 1.0000 | ORAL_TABLET | Freq: Every evening | ORAL | Status: DC | PRN

## 2023-01-04 MED ORDER — ACETAMINOPHEN 650 MG RE SUPP: 650.0000 mg | RECTAL | Status: DC | PRN

## 2023-01-04 MED ORDER — BENZONATATE 100 MG PO CAPS: 200.0000 mg | ORAL_CAPSULE | Freq: Two times a day (BID) | ORAL | Status: DC | PRN

## 2023-01-04 MED ORDER — HEPARIN SODIUM (PORCINE) 5000 UNIT/ML IJ SOLN
5000.0000 [IU] | Freq: Three times a day (TID) | INTRAMUSCULAR | Status: DC
  Administered 2023-01-05 (×2): 5000 [IU] via SUBCUTANEOUS
  Filled 2023-01-04 (×2): qty 1

## 2023-01-04 MED ORDER — LEVOCETIRIZINE DIHYDROCHLORIDE 5 MG PO TABS: 5.0000 mg | ORAL_TABLET | Freq: Every evening | ORAL | Status: DC

## 2023-01-04 MED ORDER — FLUTICASONE PROPIONATE 50 MCG/ACT NA SUSP
2.0000 | Freq: Every day | NASAL | Status: DC
  Administered 2023-01-05 – 2023-01-06 (×2): 2 via NASAL
  Filled 2023-01-04: qty 16

## 2023-01-04 MED ORDER — ASPIRIN 325 MG PO TABS
325.0000 mg | ORAL_TABLET | Freq: Once | ORAL | Status: AC
  Administered 2023-01-04: 325 mg via ORAL
  Filled 2023-01-04: qty 1

## 2023-01-04 MED ORDER — CYCLOSPORINE 0.05 % OP EMUL
1.0000 [drp] | Freq: Two times a day (BID) | OPHTHALMIC | Status: DC
  Administered 2023-01-05 – 2023-01-06 (×2): 1 [drp] via OPHTHALMIC
  Filled 2023-01-04 (×4): qty 30

## 2023-01-04 MED ORDER — PERFLUOROHEXYLOCTANE 1.338 GM/ML OP SOLN: 1.0000 [drp] | Freq: Two times a day (BID) | OPHTHALMIC | Status: DC

## 2023-01-04 MED ORDER — ACETAMINOPHEN 160 MG/5ML PO SOLN: 650.0000 mg | ORAL | Status: DC | PRN

## 2023-01-04 MED ORDER — PREDNISONE 20 MG PO TABS
20.0000 mg | ORAL_TABLET | Freq: Every day | ORAL | Status: DC
  Administered 2023-01-05 – 2023-01-06 (×2): 20 mg via ORAL
  Filled 2023-01-04 (×2): qty 1

## 2023-01-04 MED ORDER — VITAMIN B-12 1000 MCG PO TABS
1000.0000 ug | ORAL_TABLET | Freq: Every day | ORAL | Status: DC
  Administered 2023-01-05 – 2023-01-06 (×2): 1000 ug via ORAL
  Filled 2023-01-04 (×2): qty 1

## 2023-01-04 MED ORDER — ROSUVASTATIN CALCIUM 5 MG PO TABS
5.0000 mg | ORAL_TABLET | Freq: Every day | ORAL | Status: DC
  Administered 2023-01-05 – 2023-01-06 (×2): 5 mg via ORAL
  Filled 2023-01-04 (×2): qty 1

## 2023-01-04 MED ORDER — VARENICLINE TARTRATE 0.03 MG/ACT NA SOLN: 1.0000 | Freq: Two times a day (BID) | NASAL | Status: DC

## 2023-01-04 MED ORDER — STROKE: EARLY STAGES OF RECOVERY BOOK
Freq: Once | Status: AC
  Filled 2023-01-04: qty 1

## 2023-01-04 MED ORDER — ALPRAZOLAM 0.5 MG PO TABS: 1.0000 mg | ORAL_TABLET | Freq: Every evening | ORAL | Status: DC | PRN

## 2023-01-04 MED ORDER — SODIUM CHLORIDE 0.9 % IV SOLN: INTRAVENOUS | Status: DC

## 2023-01-04 MED ORDER — ACETAMINOPHEN 325 MG PO TABS: 650.0000 mg | ORAL_TABLET | ORAL | Status: DC | PRN

## 2023-01-04 MED ORDER — CEFDINIR 300 MG PO CAPS
300.0000 mg | ORAL_CAPSULE | Freq: Two times a day (BID) | ORAL | Status: DC
  Administered 2023-01-05 – 2023-01-06 (×3): 300 mg via ORAL
  Filled 2023-01-04 (×5): qty 1

## 2023-01-04 MED ORDER — SODIUM CHLORIDE 0.9 % IV BOLUS
500.0000 mL | Freq: Once | INTRAVENOUS | Status: AC
  Administered 2023-01-04: 500 mL via INTRAVENOUS

## 2023-01-04 NOTE — ED Triage Notes (Signed)
Pt BIB [facility] from home for stroke like symptoms, reports LKW 1500, pt had difficulty getting words out for 5-8 minutes, pt reports weakness x a couple weeks, symptoms discovered at 1630, pt assessed by EDP upon arrival, verbal order to cancel code stroke as suspected TIA, vs en route 96%RA, RR 16, 150/76, CBG 115

## 2023-01-04 NOTE — H&P (Signed)
TRH H&P   Patient Demographics:    Karim Mccoig, is a 67 y.o. male  MRN: [id number]   DOB - 16-Dec-1955  Admit Date - 01/04/2023  Outpatient Primary MD for the patient is [name], FNP  Referring MD/NP/PA: Dr [email or web address]  Patient coming from: home  Chief Complaint  Patient presents with   Transient Ischemic Attack      HPI:    Kinneth Shaler  is a 67 y.o. male, past medical history of allergy, chronic sinusitis, status post meningioma removal behind his right eye, mild residual right extremity weakness, tongue deviation and ptosis. -Reports sinusitis over the last week, with fever, chills 10 days, has been prescribed steroid course and antibiotic by his PCP, patient with intermittent cough, he had a cough spell today, after that he had an episode lasted around 8 minutes, for he was unable to speak or express his thoughts, he denies any other focal deficits, tingling or numbness or loss of consciousness, wife brought him to ED for further evaluation. -In ED CT head with no acute findings (beside known post operative changes), labs significant for white blood cell count 17.9, respiratory viral panel is pending, ED physician discussed with neurology Dr. Darlyn Read who recommended admission for TIA workup, patient will be admitted to [facility] as MRI machine and the repair till next Tuesday.     Review of systems:     A full 10 point Review of Systems was done, except as stated above, all other Review of Systems were negative.   With Past History of the following :    Past Medical History:  Diagnosis Date   Anxiety    Has headaches associated with past surgery.   Cancer (HCC)    Brain    Hearing loss    decreased 95% right / decreased 24% left ear      Past Surgical History:  Procedure Laterality Date   APPENDECTOMY     EYE SURGERY Right    Tumor removed behind eye.    TONSILLECTOMY        Social History:     Social History   Tobacco Use   Smoking status: Former   Smokeless tobacco: Never   Tobacco comments:    quit 40 years ago  Substance Use Topics   Alcohol use: No       Family History :     Family History  Problem Relation Age of Onset   Heart disease Mother        Heart enlarged and mechanical valve surgery.   Hypertension Mother    Heart disease Father        5 bypass   Hypertension Father       Home Medications:   Prior to Admission medications   Medication Sig Start Date End Date Taking? Authorizing Provider  acetaminophen (TYLENOL) 500 MG tablet Take 1 tablet (500 mg  total) by mouth every 6 (six) hours as needed. 05/27/20  Yes Daryll Drown, NP  albuterol (VENTOLIN HFA) 108 (90 Base) MCG/ACT inhaler Inhale 2 puffs into the lungs every 6 (six) hours as needed for wheezing or shortness of breath. 12/31/22  Yes Hawks, Christy A, FNP  ALPRAZolam Prudy Feeler) 1 MG tablet Take 1 tablet (1 mg total) by mouth at bedtime as needed for anxiety. 12/11/22  Yes Hawks, Christy A, FNP  benzonatate (TESSALON) 200 MG capsule Take 1 capsule (200 mg total) by mouth 2 (two) times daily as needed for cough. 12/28/22  Yes Dettinger, Elige Radon, MD  cefdinir (OMNICEF) 300 MG capsule Take 1 capsule (300 mg total) by mouth 2 (two) times daily. 1 po BID Patient taking differently: Take 300 mg by mouth 2 (two) times daily. 10 days supply 12/28/22  Yes Dettinger, Elige Radon, MD  Cholecalciferol (VITAMIN D) 2000 units CAPS Take by mouth.   Yes [provider]  cycloSPORINE (RESTASIS) 0.05 % ophthalmic emulsion 1 drop 2 (two) times daily.   Yes [provider]  fluticasone (FLONASE) 50 MCG/ACT nasal spray Place 2 sprays into both nostrils daily. 08/29/22  Yes Daryll Drown, NP  levocetirizine (XYZAL) 5 MG tablet Take 5 mg by mouth every evening.   Yes [provider]  MIEBO 1.338 GM/ML SOLN Apply 1 drop to eye 2 (two) times daily. 05/28/22   Yes [provider]  Potassium 99 MG TABS Take 1 tablet by mouth daily.   Yes [provider]  predniSONE (STERAPRED UNI-PAK 21 TAB) 10 MG (21) TBPK tablet Use as directed 12/31/22  Yes Hawks, Christy A, FNP  rosuvastatin (CRESTOR) 10 MG tablet Take 0.5 tablets (5 mg total) by mouth daily. 12/11/22  Yes Hawks, Christy A, FNP  triamcinolone ointment (KENALOG) 0.5 % Apply 1 Application topically 2 (two) times daily. 03/02/22  Yes Hawks, Christy A, FNP  TYRVAYA 0.03 MG/ACT SOLN Place 1 spray into both nostrils 2 (two) times daily. 12/19/21  Yes [provider]  vitamin B-12 (CYANOCOBALAMIN) 1000 MCG tablet Take 1,000 mcg by mouth daily.   Yes [provider]  vitamin E 400 UNIT capsule Take 400 Units by mouth daily.   Yes [provider]  Zinc Sulfate (ZINC 15 PO) Take by mouth.   Yes [provider]  Bepotastine Besilate 1.5 % SOLN     [provider]  Cenegermin-bkbj (OXERVATE) 0.002 % SOLN Apply to eye. Patient not taking: Reported on 01/04/2023    [provider]     Allergies:     Allergies  Allergen Reactions   Atorvastatin Other (See Comments)    Muscle aches   Carbamazepine Nausea And Vomiting    Chest tightness Chest tightness    Pregabalin Nausea And Vomiting   Augmentin [Amoxicillin-Pot Clavulanate] Diarrhea   Latex Dermatitis     Physical Exam:   Vitals  Blood pressure 133/74, pulse 61, temperature 97.6 F (36.4 C), temperature source Oral, resp. rate (!) 23, height 6\' 1"  (1.854 m), weight 80.7 kg, SpO2 92 %.   1. General frail male,  lying in bed in NAD,   2. Normal affect and insight, Not Suicidal or Homicidal, Awake Alert, Oriented X 3.  3. Mild right eye ptosis, and mild right tongue deviation, and motor 4/5 right upper extremity..  4. Ears appear Normal, Conjunctivae clear. Moist Oral Mucosa.  5. Supple Neck, No JVD, No cervical lymphadenopathy appriciated, No Carotid Bruits.  6. Symmetrical  Chest wall movement,  Good air movement bilaterally, CTAB.  7. RRR, No Gallops, Rubs or Murmurs, No Parasternal Heave.  8. Positive Bowel Sounds, Abdomen Soft, No tenderness, No organomegaly appriciated,No rebound -guarding or rigidity.  9.  No Cyanosis, Normal Skin Turgor, No Skin Rash or Bruise.  10. Good muscle tone,  joints appear normal , no effusions, Normal ROM.     Data Review:    CBC Recent Labs  Lab 01/04/23 1650  WBC 17.9*  HGB 14.2  HCT 41.4  PLT 294  MCV 87.2  MCH 29.9  MCHC 34.3  RDW 13.2  LYMPHSABS 0.8  MONOABS 0.7  EOSABS 0.0  BASOSABS 0.0   ------------------------------------------------------------------------------------------------------------------  Chemistries  Recent Labs  Lab 01/04/23 1650  NA 135  K 4.0  CL 102  CO2 23  GLUCOSE 121*  BUN 19  CREATININE 1.05  CALCIUM 8.7*  AST 37  ALT 105*  ALKPHOS 117  BILITOT 0.5   ------------------------------------------------------------------------------------------------------------------ estimated creatinine clearance is 77.2 mL/min (by C-G formula based on SCr of 1.05 mg/dL). ------------------------------------------------------------------------------------------------------------------ No results for input(s): "TSH", "T4TOTAL", "T3FREE", "THYROIDAB" in the last 72 hours.  Invalid input(s): "FREET3"  Coagulation profile No results for input(s): "INR", "PROTIME" in the last 168 hours. ------------------------------------------------------------------------------------------------------------------- No results for input(s): "DDIMER" in the last 72 hours. -------------------------------------------------------------------------------------------------------------------  Cardiac Enzymes No results for input(s): "CKMB", "TROPONINI", "MYOGLOBIN" in the last 168 hours.  Invalid input(s):  "CK" ------------------------------------------------------------------------------------------------------------------ No results found for: "BNP"   ---------------------------------------------------------------------------------------------------------------  Urinalysis No results found for: "COLORURINE", "APPEARANCEUR", "LABSPEC", "PHURINE", "GLUCOSEU", "HGBUR", "BILIRUBINUR", "KETONESUR", "PROTEINUR", "UROBILINOGEN", "NITRITE", "LEUKOCYTESUR"  ----------------------------------------------------------------------------------------------------------------   Imaging Results:    CT Head Wo Contrast  Result Date: 01/04/2023 CLINICAL DATA:  Headaches and difficulty speaking EXAM: CT HEAD WITHOUT CONTRAST TECHNIQUE: Contiguous axial images were obtained from the base of the skull through the vertex without intravenous contrast. RADIATION DOSE REDUCTION: This exam was performed according to the departmental dose-optimization program which includes automated exposure control, adjustment of the mA and/or kV according to patient size and/or use of iterative reconstruction technique. COMPARISON:  07/27/2021, MRI from 10/09/2022 FINDINGS: Brain: Postsurgical changes are noted in the right temporal lobe with considerable encephalomalacia. No findings to suggest acute hemorrhage or acute infarction are seen. Persistent density is noted along the inferior aspect of the right tentorium centrally similar to that seen on prior MRI examination consistent with the known meningioma. The more supra tentorial component is not as well visualized due to the lack of IV contrast. Vascular: No hyperdense vessel or unexpected calcification. Skull: Postsurgical changes are noted in the right frontal parietal region. Additionally changes are noted along the anterior and posterior portions of the zygomatic arch on right. Sinuses/Orbits: Orbits and their contents are within normal limits. Air-fluid level is noted within the  left maxillary antrum new from the prior exam. Other: None. IMPRESSION: Postsurgical changes in the right temporal region as described. Stable appearing meningioma at along the right tentorium anteriorly. No acute intracranial abnormality is seen. New air-fluid level within the left maxillary antrum which may represent acute sinusitis. Electronically Signed   By: Alcide Clever M.D.   On: 01/04/2023 17:44   DG Chest Port 1 View  Result Date: 01/04/2023 CLINICAL DATA:  Shortness of breath EXAM: PORTABLE CHEST 1 VIEW COMPARISON:  CXR 12/31/22 FINDINGS: Low lung volumes. No pleural effusion. No pneumothorax. No focal airspace opacity. Normal cardiac and mediastinal contours. No radiographically apparent displaced rib fractures. Visualized upper abdomen is unremarkable. IMPRESSION: No focal airspace opacity. Electronically Signed   By:  Lorenza Cambridge M.D.   On: 01/04/2023 17:25    EKG:   Vent. rate 67 BPM PR interval 145 ms QRS duration 104 ms QT/QTcB 427/451 ms P-R-T axes 52 15 54 Sinus rhythm   Assessment & Plan:    Principal Problem:   TIA (transient ischemic attack) Active Problems:   Meningioma of right sphenoid wing involving cavernous sinus (HCC)   Chronic sinusitis   GAD (generalized anxiety disorder)    TIA -She presents with an episode of lasting up to 8 minutes of unable to speak, and Juliane for TIA as the physician discussed with Dr. Darlyn Read.  Recommendation to admit for TIA workup - as well would be related to his acute illness -He admitted under TIA pathway, will check MRI brain, CTA head and neck, 2D echo, continue to monitor on telemetry,check A1c and lipid panel.-(Patient with underlying mild tongue deviation and right upper extremity weakness).  Acute sinusitis -Patient presents with congestion, runny nose, he is with known history of chronic sinusitis, but much worsened recently with evidence of purulent secretions. - C/W cefdinir  Leukocytosis - Most likely due to  sinusitis and steroids at home.  DVT Prophylaxis Heparin   AM Labs Ordered, also please review Full Orders  Family Communication: Admission, patients condition and plan of care including tests being ordered have been discussed with the patient and wife at bedside who indicate understanding and agree with the plan and Code Status.  Code Status full  Likely DC to  home  Condition GUARDED    Consults called: none    Admission status: observation    Time spent in minutes : 55 minutes   Huey Bienenstock M.D on 01/04/2023 at 7:17 PM   [facility] - Office  [phone]

## 2023-01-04 NOTE — ED Notes (Signed)
ED TO INPATIENT HANDOFF REPORT  ED Nurse Name and Phone #:   S Name/Age/Gender Frank Hayden 67 y.o. male Room/Bed: APA07/APA07  Code Status   Code Status: Full Code  Home/SNF/Other Home Patient oriented to: self, place, time, and situation Is this baseline? Yes   Triage Complete: Triage complete  Chief Complaint TIA (transient ischemic attack) [G45.9]  Triage Note Pt BIB [facility] from home for stroke like symptoms, reports LKW 1500, pt had difficulty getting words out for 5-8 minutes, pt reports weakness x a couple weeks, symptoms discovered at 1630, pt assessed by EDP upon arrival, verbal order to cancel code stroke as suspected TIA, vs en route 96%RA, RR 16, 150/76, CBG 115   Allergies Allergies  Allergen Reactions   Atorvastatin Other (See Comments)    Muscle aches   Carbamazepine Nausea And Vomiting    Chest tightness Chest tightness    Pregabalin Nausea And Vomiting   Augmentin [Amoxicillin-Pot Clavulanate] Diarrhea   Latex Dermatitis    Level of Care/Admitting Diagnosis ED Disposition     ED Disposition  Admit   Condition  --   Comment  Hospital Area: [facility] [[id number]]  Level of Care: Telemetry Medical [104]  May place patient in observation at [facility] or [facility] if equivalent level of care is available:: No  Covid Evaluation: Asymptomatic - no recent exposure (last 10 days) testing not required  Diagnosis: TIA (transient ischemic attack) [161096]  Admitting Physician: Chiquita Loth  Attending Physician: Randol Kern, DAWOOD S [[id number]]          B Medical/Surgery History Past Medical History:  Diagnosis Date   Anxiety    Has headaches associated with past surgery.   Cancer (HCC)    Brain    Hearing loss    decreased 95% right / decreased 24% left ear   Past Surgical History:  Procedure Laterality Date   APPENDECTOMY     EYE SURGERY Right    Tumor removed behind eye.   TONSILLECTOMY       A IV  Location/Drains/Wounds Patient Lines/Drains/Airways Status     Active Line/Drains/Airways     Name Placement date Placement time Site Days   Peripheral IV 07/11/12 Right Forearm 07/11/12  1228  Forearm  3829   Peripheral IV 07/04/15 Left Antecubital 07/04/15  1408  Antecubital  2741   Peripheral IV 12/31/18 Distal;Right;Lateral Forearm 12/31/18  1435  Forearm  1465   Peripheral IV 10/06/21 22 G 1" Left;Posterior Hand 10/06/21  1522  Hand  455   Peripheral IV 01/04/23 18 G Left Forearm 01/04/23  1640  Forearm  less than 1            Intake/Output Last 24 hours No intake or output data in the 24 hours ending 01/04/23 1927  Labs/Imaging Results for orders placed or performed during the hospital encounter of 01/04/23 (from the past 48 hour(s))  CBG monitoring, ED     Status: Abnormal   Collection Time: 01/04/23  4:50 PM  Result Value Ref Range   Glucose-Capillary 104 (H) 70 - 99 mg/dL    Comment: Glucose reference range applies only to samples taken after fasting for at least 8 hours.  CBC with Differential     Status: Abnormal   Collection Time: 01/04/23  4:50 PM  Result Value Ref Range   WBC 17.9 (H) 4.0 - 10.5 K/uL   RBC 4.75 4.22 - 5.81 MIL/uL   Hemoglobin 14.2 13.0 - 17.0 g/dL  HCT 41.4 39.0 - 52.0 %   MCV 87.2 80.0 - 100.0 fL   MCH 29.9 26.0 - 34.0 pg   MCHC 34.3 30.0 - 36.0 g/dL   RDW 16.1 09.6 - 04.5 %   Platelets 294 150 - 400 K/uL   nRBC 0.0 0.0 - 0.2 %   Neutrophils Relative % 88 %   Neutro Abs 15.8 (H) 1.7 - 7.7 K/uL   Lymphocytes Relative 4 %   Lymphs Abs 0.8 0.7 - 4.0 K/uL   Monocytes Relative 4 %   Monocytes Absolute 0.7 0.1 - 1.0 K/uL   Eosinophils Relative 0 %   Eosinophils Absolute 0.0 0.0 - 0.5 K/uL   Basophils Relative 0 %   Basophils Absolute 0.0 0.0 - 0.1 K/uL   Immature Granulocytes 4 %   Abs Immature Granulocytes 0.67 (H) 0.00 - 0.07 K/uL    Comment: Performed at [facility], [address], [place], [place] [address]  Comprehensive metabolic  panel     Status: Abnormal   Collection Time: 01/04/23  4:50 PM  Result Value Ref Range   Sodium 135 135 - 145 mmol/L   Potassium 4.0 3.5 - 5.1 mmol/L   Chloride 102 98 - 111 mmol/L   CO2 23 22 - 32 mmol/L   Glucose, Bld 121 (H) 70 - 99 mg/dL    Comment: Glucose reference range applies only to samples taken after fasting for at least 8 hours.   BUN 19 8 - 23 mg/dL   Creatinine, Ser 1.91 0.61 - 1.24 mg/dL   Calcium 8.7 (L) 8.9 - 10.3 mg/dL   Total Protein 7.0 6.5 - 8.1 g/dL   Albumin 3.4 (L) 3.5 - 5.0 g/dL   AST 37 15 - 41 U/L   ALT 105 (H) 0 - 44 U/L   Alkaline Phosphatase 117 38 - 126 U/L   Total Bilirubin 0.5 0.3 - 1.2 mg/dL   GFR, Estimated >47 >82 mL/min    Comment: (NOTE) Calculated using the CKD-EPI Creatinine Equation (2021)    Anion gap 10 5 - 15    Comment: Performed at [facility], [address], [place], [place] [address]   CT Head Wo Contrast  Result Date: 01/04/2023 CLINICAL DATA:  Headaches and difficulty speaking EXAM: CT HEAD WITHOUT CONTRAST TECHNIQUE: Contiguous axial images were obtained from the base of the skull through the vertex without intravenous contrast. RADIATION DOSE REDUCTION: This exam was performed according to the departmental dose-optimization program which includes automated exposure control, adjustment of the mA and/or kV according to patient size and/or use of iterative reconstruction technique. COMPARISON:  07/27/2021, MRI from 10/09/2022 FINDINGS: Brain: Postsurgical changes are noted in the right temporal lobe with considerable encephalomalacia. No findings to suggest acute hemorrhage or acute infarction are seen. Persistent density is noted along the inferior aspect of the right tentorium centrally similar to that seen on prior MRI examination consistent with the known meningioma. The more supra tentorial component is not as well visualized due to the lack of IV contrast. Vascular: No hyperdense vessel or unexpected calcification. Skull:  Postsurgical changes are noted in the right frontal parietal region. Additionally changes are noted along the anterior and posterior portions of the zygomatic arch on right. Sinuses/Orbits: Orbits and their contents are within normal limits. Air-fluid level is noted within the left maxillary antrum new from the prior exam. Other: None. IMPRESSION: Postsurgical changes in the right temporal region as described. Stable appearing meningioma at along the right tentorium anteriorly. No acute intracranial  abnormality is seen. New air-fluid level within the left maxillary antrum which may represent acute sinusitis. Electronically Signed   By: Alcide Clever M.D.   On: 01/04/2023 17:44   DG Chest Port 1 View  Result Date: 01/04/2023 CLINICAL DATA:  Shortness of breath EXAM: PORTABLE CHEST 1 VIEW COMPARISON:  CXR 12/31/22 FINDINGS: Low lung volumes. No pleural effusion. No pneumothorax. No focal airspace opacity. Normal cardiac and mediastinal contours. No radiographically apparent displaced rib fractures. Visualized upper abdomen is unremarkable. IMPRESSION: No focal airspace opacity. Electronically Signed   By: Lorenza Cambridge M.D.   On: 01/04/2023 17:25    Pending Labs Unresulted Labs (From admission, onward)     Start     Ordered   01/04/23 1901  Respiratory (~20 pathogens) panel by PCR  (Respiratory panel by PCR (~20 pathogens, ~24 hr TAT)  w precautions)  Once,   R        01/04/23 1900   Signed and Held  HIV Antibody (routine testing w rflx)  (HIV Antibody (Routine testing w reflex) panel)  Add-on,   R        Signed and Held   Signed and Held  Lipid panel  (Labs)  Tomorrow morning,   R       Comments: Fasting    Signed and Held   Signed and Held  Hemoglobin A1c  (Labs)  Once,   R       Comments: To assess prior glycemic control    Signed and Held            Vitals/Pain Today's Vitals   01/04/23 1649 01/04/23 1650 01/04/23 1700  BP: (!) 144/83  133/74  Pulse: 73  61  Resp: 16  (!) 23  Temp:  97.6 F (36.4 C)    TempSrc: Oral    SpO2: 96%  92%  Weight:  178 lb (80.7 kg)   Height:  6\' 1"  (1.854 m)   PainSc:  0-No pain     Isolation Precautions Droplet precaution  Medications Medications  aspirin tablet 325 mg (has no administration in time range)  sodium chloride 0.9 % bolus 500 mL (500 mLs Intravenous New Bag/Given 01/04/23 1725)    Mobility walks     Focused Assessments    R Recommendations: See Admitting Provider Note  Report given to:   Additional Notes:

## 2023-01-04 NOTE — ED Provider Notes (Signed)
[place] [facility]MENT AT [facility] Provider Note   CSN: [id number] Arrival date & time: 01/04/23  1639     History {Add pertinent medical, surgical, social history, OB history to HPI:1} Chief Complaint  Patient presents with   Transient Ischemic Attack    MASIN MUCHA is a 67 y.o. male.  Patient has a history of a tumor removed behind his right eye.  He states that he is being treated for sinusitis and today he was unable to get words out for about 8 minutes   Weakness      Home Medications Prior to Admission medications   Medication Sig Start Date End Date Taking? Authorizing Provider  acetaminophen (TYLENOL) 500 MG tablet Take 1 tablet (500 mg total) by mouth every 6 (six) hours as needed. 05/27/20  Yes Daryll Drown, NP  albuterol (VENTOLIN HFA) 108 (90 Base) MCG/ACT inhaler Inhale 2 puffs into the lungs every 6 (six) hours as needed for wheezing or shortness of breath. 12/31/22  Yes Hawks, Christy A, FNP  ALPRAZolam Prudy Feeler) 1 MG tablet Take 1 tablet (1 mg total) by mouth at bedtime as needed for anxiety. 12/11/22  Yes Hawks, Christy A, FNP  benzonatate (TESSALON) 200 MG capsule Take 1 capsule (200 mg total) by mouth 2 (two) times daily as needed for cough. 12/28/22  Yes Dettinger, Elige Radon, MD  cefdinir (OMNICEF) 300 MG capsule Take 1 capsule (300 mg total) by mouth 2 (two) times daily. 1 po BID Patient taking differently: Take 300 mg by mouth 2 (two) times daily. 10 days supply 12/28/22  Yes Dettinger, Elige Radon, MD  Cholecalciferol (VITAMIN D) 2000 units CAPS Take by mouth.   Yes [provider]  cycloSPORINE (RESTASIS) 0.05 % ophthalmic emulsion 1 drop 2 (two) times daily.   Yes [provider]  fluticasone (FLONASE) 50 MCG/ACT nasal spray Place 2 sprays into both nostrils daily. 08/29/22  Yes Daryll Drown, NP  levocetirizine (XYZAL) 5 MG tablet Take 5 mg by mouth every evening.   Yes [provider]  MIEBO 1.338 GM/ML SOLN  Apply 1 drop to eye 2 (two) times daily. 05/28/22  Yes [provider]  Potassium 99 MG TABS Take 1 tablet by mouth daily.   Yes [provider]  predniSONE (STERAPRED UNI-PAK 21 TAB) 10 MG (21) TBPK tablet Use as directed 12/31/22  Yes Hawks, Christy A, FNP  rosuvastatin (CRESTOR) 10 MG tablet Take 0.5 tablets (5 mg total) by mouth daily. 12/11/22  Yes Hawks, Christy A, FNP  triamcinolone ointment (KENALOG) 0.5 % Apply 1 Application topically 2 (two) times daily. 03/02/22  Yes Hawks, Christy A, FNP  TYRVAYA 0.03 MG/ACT SOLN Place 1 spray into both nostrils 2 (two) times daily. 12/19/21  Yes [provider]  vitamin B-12 (CYANOCOBALAMIN) 1000 MCG tablet Take 1,000 mcg by mouth daily.   Yes [provider]  vitamin E 400 UNIT capsule Take 400 Units by mouth daily.   Yes [provider]  Zinc Sulfate (ZINC 15 PO) Take by mouth.   Yes [provider]  Bepotastine Besilate 1.5 % SOLN     [provider]  Cenegermin-bkbj (OXERVATE) 0.002 % SOLN Apply to eye. Patient not taking: Reported on 01/04/2023    [provider]      Allergies    Atorvastatin, Carbamazepine, Pregabalin, Augmentin [amoxicillin-pot clavulanate], and Latex    Review of Systems   Review of Systems  Neurological:  Positive for weakness.  Physical Exam Updated Vital Signs BP 133/74   Pulse 61   Temp 97.6 F (36.4 C) (Oral)   Resp (!) 23   Ht 6\' 1"  (1.854 m)   Wt 80.7 kg   SpO2 92%   BMI 23.48 kg/m  Physical Exam  ED Results / Procedures / Treatments   Labs (all labs ordered are listed, but only abnormal results are displayed) Labs Reviewed  CBC WITH DIFFERENTIAL/PLATELET - Abnormal; Notable for the following components:      Result Value   WBC 17.9 (*)    Neutro Abs 15.8 (*)    Abs Immature Granulocytes 0.67 (*)    All other components within normal limits  COMPREHENSIVE METABOLIC PANEL - Abnormal; Notable for the following components:    Glucose, Bld 121 (*)    Calcium 8.7 (*)    Albumin 3.4 (*)    ALT 105 (*)    All other components within normal limits  CBG MONITORING, ED - Abnormal; Notable for the following components:   Glucose-Capillary 104 (*)    All other components within normal limits    EKG None  Radiology CT Head Wo Contrast  Result Date: 01/04/2023 CLINICAL DATA:  Headaches and difficulty speaking EXAM: CT HEAD WITHOUT CONTRAST TECHNIQUE: Contiguous axial images were obtained from the base of the skull through the vertex without intravenous contrast. RADIATION DOSE REDUCTION: This exam was performed according to the departmental dose-optimization program which includes automated exposure control, adjustment of the mA and/or kV according to patient size and/or use of iterative reconstruction technique. COMPARISON:  07/27/2021, MRI from 10/09/2022 FINDINGS: Brain: Postsurgical changes are noted in the right temporal lobe with considerable encephalomalacia. No findings to suggest acute hemorrhage or acute infarction are seen. Persistent density is noted along the inferior aspect of the right tentorium centrally similar to that seen on prior MRI examination consistent with the known meningioma. The more supra tentorial component is not as well visualized due to the lack of IV contrast. Vascular: No hyperdense vessel or unexpected calcification. Skull: Postsurgical changes are noted in the right frontal parietal region. Additionally changes are noted along the anterior and posterior portions of the zygomatic arch on right. Sinuses/Orbits: Orbits and their contents are within normal limits. Air-fluid level is noted within the left maxillary antrum new from the prior exam. Other: None. IMPRESSION: Postsurgical changes in the right temporal region as described. Stable appearing meningioma at along the right tentorium anteriorly. No acute intracranial abnormality is seen. New air-fluid level within the left maxillary antrum which  may represent acute sinusitis. Electronically Signed   By: Alcide Clever M.D.   On: 01/04/2023 17:44   DG Chest Port 1 View  Result Date: 01/04/2023 CLINICAL DATA:  Shortness of breath EXAM: PORTABLE CHEST 1 VIEW COMPARISON:  CXR 12/31/22 FINDINGS: Low lung volumes. No pleural effusion. No pneumothorax. No focal airspace opacity. Normal cardiac and mediastinal contours. No radiographically apparent displaced rib fractures. Visualized upper abdomen is unremarkable. IMPRESSION: No focal airspace opacity. Electronically Signed   By: Lorenza Cambridge M.D.   On: 01/04/2023 17:25    Procedures Procedures  {Document cardiac monitor, telemetry assessment procedure when appropriate:1}  Medications Ordered in ED Medications  aspirin tablet 325 mg (has no administration in time range)  sodium chloride 0.9 % bolus 500 mL (500 mLs Intravenous New Bag/Given 01/04/23 1725)    ED Course/ Medical Decision Making/ A&P  Pt with a tia.  CT scan unremarkable.  Symptoms only lasted for few minutes.  I  spoke with Dr. Selina Cooley neurology and she recommended admission to [facility] for further workup Click here for ABCD2, HEART and other calculatorsREFRESH Note before signing :1}                          Medical Decision Making Amount and/or Complexity of Data Reviewed Labs: ordered. Radiology: ordered. ECG/medicine tests: ordered.  Risk OTC drugs. Decision regarding hospitalization.   Patient will be transferred to [facility] and admitted to medicine with neurology consult  {Document critical care time when appropriate:1} {Document review of labs and clinical decision tools ie heart score, Chads2Vasc2 etc:1}  {Document your independent review of radiology images, and any outside records:1} {Document your discussion with family members, caretakers, and with consultants:1} {Document social determinants of health affecting pt's care:1} {Document your decision making why or why not admission, treatments were  needed:1} Final Clinical Impression(s) / ED Diagnoses Final diagnoses:  TIA (transient ischemic attack)    Rx / DC Orders ED Discharge Orders     None

## 2023-01-05 ENCOUNTER — Observation Stay (HOSPITAL_COMMUNITY): Payer: BC Managed Care – PPO

## 2023-01-05 ENCOUNTER — Observation Stay (HOSPITAL_BASED_OUTPATIENT_CLINIC_OR_DEPARTMENT_OTHER): Payer: BC Managed Care – PPO

## 2023-01-05 DIAGNOSIS — G459 Transient cerebral ischemic attack, unspecified: Secondary | ICD-10-CM

## 2023-01-05 DIAGNOSIS — I6521 Occlusion and stenosis of right carotid artery: Secondary | ICD-10-CM | POA: Diagnosis not present

## 2023-01-05 LAB — ECHOCARDIOGRAM COMPLETE
Area-P 1/2: 2.26 cm2
Height: 73 in
S' Lateral: 3.05 cm
Weight: 2800.72 oz

## 2023-01-05 LAB — LIPID PANEL
Cholesterol: 187 mg/dL (ref 0–200)
HDL: 28 mg/dL — ABNORMAL LOW (ref >40)
LDL Cholesterol: 124 mg/dL — ABNORMAL HIGH (ref 0–99)
Total CHOL/HDL Ratio: 6.7 RATIO
Triglycerides: 176 mg/dL — ABNORMAL HIGH (ref <150)
VLDL: 35 mg/dL (ref 0–40)

## 2023-01-05 LAB — HEMOGLOBIN A1C
Hgb A1c MFr Bld: 5.9 % — ABNORMAL HIGH (ref 4.8–5.6)
Mean Plasma Glucose: 122.63 mg/dL

## 2023-01-05 LAB — HIV ANTIBODY (ROUTINE TESTING W REFLEX): HIV Screen 4th Generation wRfx: NONREACTIVE

## 2023-01-05 MED ORDER — LORATADINE 10 MG PO TABS
10.0000 mg | ORAL_TABLET | Freq: Every evening | ORAL | Status: DC
  Administered 2023-01-05: 10 mg via ORAL
  Filled 2023-01-05: qty 1

## 2023-01-05 MED ORDER — IOHEXOL 350 MG/ML SOLN
75.0000 mL | Freq: Once | INTRAVENOUS | Status: AC | PRN
  Administered 2023-01-05: 75 mL via INTRAVENOUS

## 2023-01-05 MED ORDER — PERFLUTREN LIPID MICROSPHERE
1.0000 mL | INTRAVENOUS | Status: DC | PRN
  Administered 2023-01-05: 4 mL via INTRAVENOUS

## 2023-01-05 MED ORDER — ASPIRIN 81 MG PO TBEC
81.0000 mg | DELAYED_RELEASE_TABLET | Freq: Every day | ORAL | Status: DC
  Administered 2023-01-05 – 2023-01-06 (×2): 81 mg via ORAL
  Filled 2023-01-05 (×2): qty 1

## 2023-01-05 MED ORDER — ENOXAPARIN SODIUM 40 MG/0.4ML IJ SOSY
40.0000 mg | PREFILLED_SYRINGE | INTRAMUSCULAR | Status: DC
  Administered 2023-01-06: 40 mg via SUBCUTANEOUS
  Filled 2023-01-05: qty 0.4

## 2023-01-05 NOTE — Plan of Care (Signed)
  Problem: Education: Goal: Knowledge of General Education information will improve Description: Including pain rating scale, medication(s)/side effects and non-pharmacologic comfort measures Outcome: Progressing   Problem: Clinical Measurements: Goal: Respiratory complications will improve Outcome: Progressing   Problem: Nutrition: Goal: Adequate nutrition will be maintained Outcome: Progressing   Problem: Safety: Goal: Ability to remain free from injury will improve Outcome: Progressing   Problem: Skin Integrity: Goal: Risk for impaired skin integrity will decrease Outcome: Progressing   Problem: Coping: Goal: Will verbalize positive feelings about self Outcome: Progressing Goal: Will identify appropriate support needs Outcome: Progressing

## 2023-01-05 NOTE — Progress Notes (Addendum)
Frank Hayden  WUJ:[id number] DOB: June 29, 1956 DOA: 01/04/2023 PCP: [name], FNP    Brief Narrative:  67 year old with a history of chronic sinusitis who is status post meningioma resection with resultant mild residual right extremity weakness, tongue deviation, and ptosis who has been undergoing treatment for recurrent sinusitis as an outpatient over the last week.  On 01/04/2023 he experienced a coughing spell at home after which he was acutely unable to speak.  There were no other focal neurologic deficits.  This episode lasted for about 8 minutes.  He was brought to the ER at [facility] where CT head was without acute findings.  The decision was made to admit the patient for a  CVA workup, but due to the fact that the MRI scanner at [facility] is broken the patient had to be transferred to [facility].  Consultants:  Neurology - Stroke Team  Goals of Care:  Code Status: Full Code   DVT prophylaxis: Lovenox  Interim Hx: Afebrile.  Vital signs are stable.  Resting comfortably in bed at the time of my visit.  States that he feels he is at his baseline.  Denies chest pain shortness of breath fevers or chills.  No focal weakness or difficulty forming words/speaking.  Denies appreciable facial droop.  Assessment & Plan:  Acute CVA -Resultant transient expressive aphasia -MRI brain reveals a small acute infarct of the right caudate head as well as an unchanged large area of encephalomalacia of the anterior right frontal lobe and right temporal lobes -CTa head and neck noted occlusion of the right MCA proximal M1 segment unchanged compared to postcontrast images from 10/09/2022 MRI as well as mild stenosis of the right ICA at the skull base but no hemodynamically significant stenosis in the neck -TTE noted EF 60-65% with no WMA and grade 1 DD and no apparent valvular abnormalities or shunt -medical treatment:   on no prior plt meds - ASA initiated while awaiting Neuro recs -LDL  124 -A1c 5.9 -PT/OT suggest no further evaluation needed -SLP following -Allow for permissive hypertension  Acute on chronic sinusitis Continue cefdinir which was initiated in outpatient setting  Known meningioma of the right sphenoid wing involving the cavernous sinus Previously underwent gamma knife procedures in 2005 and 2016 with resection of a large portion of this tumor, preceded by open surgery in 2000 (right frontal craniotomy for resection of right cavernous sinus tumor) - remnant appears to be stable on follow-up imaging this admission  Family Communication: Spoke with wife and daughter at bedside Disposition: From home -anticipate eventual discharge home once workup complete    Objective: Blood pressure 134/73, pulse (!) 59, temperature 97.8 F (36.6 C), temperature source Oral, resp. rate 18, height 6\' 1"  (1.854 m), weight 79.4 kg, SpO2 96 %.  Intake/Output Summary (Last 24 hours) at 01/05/2023 0956 Last data filed at 01/05/2023 0326 Gross per 24 hour  Intake 89.72 ml  Output --  Net 89.72 ml   Filed Weights   01/04/23 1650 01/04/23 2351  Weight: 80.7 kg 79.4 kg    Examination: General: No acute respiratory distress Lungs: Clear to auscultation bilaterally without wheezes or crackles Cardiovascular: Regular rate and rhythm without murmur gallop or rub normal S1 and S2 Abdomen: Nontender, nondistended, soft, bowel sounds positive, no rebound, no ascites, no appreciable mass Extremities: No significant cyanosis, clubbing, or edema bilateral lower extremities  CBC: Recent Labs  Lab 01/04/23 1650  WBC 17.9*  NEUTROABS 15.8*  HGB 14.2  HCT 41.4  MCV 87.2  PLT 294   Basic Metabolic Panel: Recent Labs  Lab 01/04/23 1650  NA 135  K 4.0  CL 102  CO2 23  GLUCOSE 121*  BUN 19  CREATININE 1.05  CALCIUM 8.7*   GFR: Estimated Creatinine Clearance: 76.7 mL/min (by C-G formula based on SCr of 1.05 mg/dL).   Scheduled Meds:   stroke: early stages of  recovery book   Does not apply Once   cefdinir  300 mg Oral BID   cyanocobalamin  1,000 mcg Oral Daily   cycloSPORINE  1 drop Both Eyes BID   fluticasone  2 spray Each Nare Daily   heparin  5,000 Units Subcutaneous Q8H   loratadine  10 mg Oral QPM   predniSONE  20 mg Oral Q breakfast   rosuvastatin  5 mg Oral Daily   Continuous Infusions:  sodium chloride 50 mL/hr at 01/05/23 0326     LOS: 0 days   Lonia Blood, MD [facility] Office  [phone] Pager - Text Page per Loretha Stapler  If 7PM-7AM, please contact night-coverage per Amion 01/05/2023, 9:56 AM

## 2023-01-05 NOTE — Progress Notes (Signed)
OT Cancellation Note  Patient Details Name: RAHEIM KOSMATKA MRN: [id number] DOB: 1955/11/04   Cancelled Treatment:    Reason Eval/Treat Not Completed: OT screened, no needs identified, will sign off.  Per conversation with PT, pt reports he is at baseline with mobility and ADLs.  He does not wish to have PT or OT evaluation. Will sign off.   Raynald Kemp, OT Acute Rehabilitation Services Office: [phone]   Pilar Grammes 01/05/2023, 10:40 AM

## 2023-01-05 NOTE — Progress Notes (Signed)
PT Cancellation/DC Note  Patient Details Name: Frank Hayden MRN: [id number] DOB: [birth year]   Cancelled Treatment:    Reason Eval/Treat Not Completed: Patient declined. He reports no new issues with balance or gait. He reports he has been walking himself to the bathroom while he has been here. He does not wish to have PT or OT evaluation. Will sign off.  Lavona Mound, PT   Acute Rehabilitation Services  Office [phone] 01/05/2023    Donnella Sham 01/05/2023, 10:32 AM

## 2023-01-05 NOTE — Progress Notes (Signed)
*  PRELIMINARY RESULTS* Echocardiogram 2D Echocardiogram has been performed with Definity.  Stacey Drain 01/05/2023, 2:27 PM

## 2023-01-05 NOTE — Evaluation (Signed)
Speech Language Pathology Evaluation Patient Details Name: KYZIER ISGRO MRN: [id number] DOB: 06/28/1956 Today's Date: 01/05/2023 Time: 4098-1191 SLP Time Calculation (min) (ACUTE ONLY): 19 min  Problem List:  Patient Active Problem List   Diagnosis Date Noted   TIA (transient ischemic attack) 01/04/2023   GAD (generalized anxiety disorder) 09/11/2022   Myalgia due to statin 06/14/2022   Benzodiazepine dependence (HCC) 02/19/2020   Primary insomnia 11/18/2019   Controlled substance agreement signed 11/18/2019   Sensorineural hearing loss (SNHL), bilateral 10/03/2018   Spinal stenosis in cervical region 09/19/2018   Chronic sinusitis 06/20/2018   Meningioma (HCC) 08/12/2014   Meningioma of right sphenoid wing involving cavernous sinus (HCC) 08/12/2014   Past Medical History:  Past Medical History:  Diagnosis Date   Anxiety    Has headaches associated with past surgery.   Cancer (HCC)    Brain    Hearing loss    decreased 95% right / decreased 24% left ear   Past Surgical History:  Past Surgical History:  Procedure Laterality Date   APPENDECTOMY     EYE SURGERY Right    Tumor removed behind eye.   TONSILLECTOMY     HPI:  67 year old male admitted with an episode of inability to speak. Diagnosed with possible TIA. MRI with Small acute infarct of the right caudate head. No hemorrhage or  mass effect.   Assessment / Plan / Recommendation Clinical Impression  Cognitive-linguistic evaluation complete. Patient presents with mild-moderate deficit in the area of memory however functionally, patient able to accurately recall and report all medical and recent information accurately. Note that MRI indicates a small right CVA. Patient has not yet been informed of MRI results by neuro yet and no family present to confirm what baseline cognitive status is. SLP will plan to f/u for diagnostic treatment and discussion with family in order to determine need for additional f/u.    SLP  Assessment  SLP Recommendation/Assessment: Patient needs continued Speech Lanaguage Pathology Services SLP Visit Diagnosis: Cognitive communication deficit (R41.841)    Recommendations for follow up therapy are one component of a multi-disciplinary discharge planning process, led by the attending physician.  Recommendations may be updated based on patient status, additional functional criteria and insurance authorization.    Follow Up Recommendations   (TBD)          Frequency and Duration min 1 x/week  1 week      SLP Evaluation Cognition  Overall Cognitive Status: No family/caregiver present to determine baseline cognitive functioning Arousal/Alertness: Awake/alert Orientation Level: Oriented X4 Memory: Impaired Memory Impairment: Retrieval deficit Awareness: Appears intact Problem Solving: Appears intact Safety/Judgment: Appears intact       Comprehension  Auditory Comprehension Overall Auditory Comprehension: Appears within functional limits for tasks assessed Visual Recognition/Discrimination Discrimination: Within Function Limits Reading Comprehension Reading Status: Within funtional limits    Expression Expression Primary Mode of Expression: Verbal Verbal Expression Overall Verbal Expression: Appears within functional limits for tasks assessed   Oral / Motor  Oral Motor/Sensory Function Overall Oral Motor/Sensory Function: Within functional limits (right eye ptosis at baseline) Motor Speech Overall Motor Speech: Appears within functional limits for tasks assessed           Ferdinand Lango MA, CCC-SLP  Viet Kemmerer Meryl 01/05/2023, 9:31 AM

## 2023-01-06 ENCOUNTER — Encounter (HOSPITAL_COMMUNITY): Payer: BC Managed Care – PPO

## 2023-01-06 DIAGNOSIS — I63311 Cerebral infarction due to thrombosis of right middle cerebral artery: Secondary | ICD-10-CM

## 2023-01-06 DIAGNOSIS — G459 Transient cerebral ischemic attack, unspecified: Secondary | ICD-10-CM | POA: Diagnosis not present

## 2023-01-06 MED ORDER — ROSUVASTATIN CALCIUM 20 MG PO TABS: 20.0000 mg | ORAL_TABLET | Freq: Every day | ORAL | 2 refills | Status: DC

## 2023-01-06 MED ORDER — CLOPIDOGREL BISULFATE 75 MG PO TABS
75.0000 mg | ORAL_TABLET | Freq: Every day | ORAL | Status: DC
  Administered 2023-01-06: 75 mg via ORAL
  Filled 2023-01-06: qty 1

## 2023-01-06 MED ORDER — CLOPIDOGREL BISULFATE 75 MG PO TABS: 75.0000 mg | ORAL_TABLET | Freq: Every day | ORAL | 0 refills | Status: DC

## 2023-01-06 MED ORDER — ROSUVASTATIN CALCIUM 20 MG PO TABS: 20.0000 mg | ORAL_TABLET | Freq: Every day | ORAL | Status: DC

## 2023-01-06 MED ORDER — ASPIRIN 81 MG PO TBEC: 81.0000 mg | DELAYED_RELEASE_TABLET | Freq: Every day | ORAL | 12 refills | Status: AC

## 2023-01-06 NOTE — Consult Note (Addendum)
Stroke Neurology Consultation Note  Consult Requested by: Dr. Sharon Seller  Reason for Consult: Right caudate head infarct seen on MRI  Consult Date:  01/06/23  The history was obtained from the patient, family and chart.  During history and examination, all items were able to obtain unless otherwise noted.  History of Present Illness:  Frank Hayden is an 67 y.o. Caucasian male with PMH of chronic sinusitis, acute sinusitis and meningioma behind his right eye which has been resected and has been treated with radiation several times who presents after an episode in which he began coughing and was unable to catch his breath and was unable to speak for about 5 minutes.  Symptoms completely resolved.  MRI shows small infarct in right caudate head which does not explain patient's symptoms.  Upon further questioning, patient states he was unable to speak because he could not catch his breath due to coughing.  Patient has an extensive history of radiation therapy to the right side of his head, and stroke was likely caused by small vessel disease in the setting of postradiation changes.  Date last known well: Date: 01/04/2023 Time last known well: Unable to determine tPA Given: No: Symptoms resolved MRS:  1 NIHSS:   1a Level of Conscious.: 0 1b LOC Questions: 0 1c LOC Commands: 0 2 Best Gaze: 0 3 Visual: 0 4 Facial Palsy: 0 5a Motor Arm - left: 0 5b Motor Arm - Right: 0 6a Motor Leg - Left: 0 6b Motor Leg - Right: 0 7 Limb Ataxia: 0 8 Sensory:0  9 Best Language: 0 10 Dysarthria: 0 11 Extinct and Inattention.: 0 TOTAL: 0     Past Medical History:  Diagnosis Date   Anxiety    Has headaches associated with past surgery.   Cancer (HCC)    Brain    Hearing loss    decreased 95% right / decreased 24% left ear     Past Surgical History:  Procedure Laterality Date   APPENDECTOMY     EYE SURGERY Right    Tumor removed behind eye.   TONSILLECTOMY      Family History  Problem  Relation Age of Onset   Heart disease Mother        Heart enlarged and mechanical valve surgery.   Hypertension Mother    Heart disease Father        5 bypass   Hypertension Father      Social History:  reports that he has quit smoking. He has never used smokeless tobacco. He reports that he does not drink alcohol and does not use drugs.  Review of Systems: A full ROS was attempted today and was able to be performed.  Systems assessed include - Constitutional, Eyes, HENT, Respiratory, Cardiovascular, Gastrointestinal, Genitourinary, Integument/breast, Hematologic/lymphatic, Musculoskeletal, Neurological, Behavioral/Psych, Endocrine, Allergic/Immunologic - with pertinent responses as per HPI.  Allergies:  Allergies  Allergen Reactions   Atorvastatin Other (See Comments)    Muscle aches   Carbamazepine Nausea And Vomiting    Chest tightness Chest tightness    Pregabalin Nausea And Vomiting   Augmentin [Amoxicillin-Pot Clavulanate] Diarrhea   Latex Dermatitis     Medications: Prior to Admission:  Medications Prior to Admission  Medication Sig Dispense Refill Last Dose   acetaminophen (TYLENOL) 500 MG tablet Take 1 tablet (500 mg total) by mouth every 6 (six) hours as needed. 30 tablet 0 unknown   albuterol (VENTOLIN HFA) 108 (90 Base) MCG/ACT inhaler Inhale 2 puffs into the lungs every 6 (  six) hours as needed for wheezing or shortness of breath. 8 g 0 01/04/2023   ALPRAZolam (XANAX) 1 MG tablet Take 1 tablet (1 mg total) by mouth at bedtime as needed for anxiety. 30 tablet 2 01/03/2023   benzonatate (TESSALON) 200 MG capsule Take 1 capsule (200 mg total) by mouth 2 (two) times daily as needed for cough. 30 capsule 1 01/03/2023   cefdinir (OMNICEF) 300 MG capsule Take 1 capsule (300 mg total) by mouth 2 (two) times daily. 1 po BID (Patient taking differently: Take 300 mg by mouth 2 (two) times daily. 10 days supply) 20 capsule 0 01/04/2023   Cholecalciferol (VITAMIN D) 2000 units CAPS Take  by mouth.   01/04/2023   cycloSPORINE (RESTASIS) 0.05 % ophthalmic emulsion 1 drop 2 (two) times daily.   01/04/2023   fluticasone (FLONASE) 50 MCG/ACT nasal spray Place 2 sprays into both nostrils daily. 16 g 6 unknown   levocetirizine (XYZAL) 5 MG tablet Take 5 mg by mouth every evening.   01/03/2023   MIEBO 1.338 GM/ML SOLN Apply 1 drop to eye 2 (two) times daily.   01/04/2023   OVER THE COUNTER MEDICATION Place 1 drop into the right eye 4 (four) times daily. Losartan potassium (PF) (0.5 ml) (0.8 mg/ml) oph soln     Pt spouse states this eye drop was compounded for pt.   01/04/2023   Potassium 99 MG TABS Take 1 tablet by mouth daily.   01/04/2023   predniSONE (STERAPRED UNI-PAK 21 TAB) 10 MG (21) TBPK tablet Use as directed 21 tablet 0 01/04/2023   rosuvastatin (CRESTOR) 10 MG tablet Take 0.5 tablets (5 mg total) by mouth daily. 90 tablet 3 01/03/2023   triamcinolone ointment (KENALOG) 0.5 % Apply 1 Application topically 2 (two) times daily. 30 g 0 unknown   TYRVAYA 0.03 MG/ACT SOLN Place 1 spray into both nostrils 2 (two) times daily.   01/04/2023   vitamin B-12 (CYANOCOBALAMIN) 1000 MCG tablet Take 1,000 mcg by mouth daily.   01/04/2023   vitamin E 400 UNIT capsule Take 400 Units by mouth daily.   01/04/2023   Zinc Sulfate (ZINC 15 PO) Take by mouth.   01/04/2023   Bepotastine Besilate 1.5 % SOLN        Test Results: CBC:  Recent Labs  Lab 01/04/23 1650  WBC 17.9*  NEUTROABS 15.8*  HGB 14.2  HCT 41.4  MCV 87.2  PLT 294   Basic Metabolic Panel:  Recent Labs  Lab 01/04/23 1650  NA 135  K 4.0  CL 102  CO2 23  GLUCOSE 121*  BUN 19  CREATININE 1.05  CALCIUM 8.7*   Liver Function Tests: Recent Labs  Lab 01/04/23 1650  AST 37  ALT 105*  ALKPHOS 117  BILITOT 0.5  PROT 7.0  ALBUMIN 3.4*   No results for input(s): "LIPASE", "AMYLASE" in the last 168 hours. No results for input(s): "AMMONIA" in the last 168 hours. Coagulation Studies: No results for input(s): "LABPROT", "INR" in  the last 72 hours. Cardiac Enzymes: No results for input(s): "CKTOTAL", "CKMB", "CKMBINDEX", "TROPONINI" in the last 168 hours. BNP: Invalid input(s): "POCBNP" CBG:  Recent Labs  Lab 01/04/23 1650  GLUCAP 104*   Urinalysis: No results for input(s): "COLORURINE", "LABSPEC", "PHURINE", "GLUCOSEU", "HGBUR", "BILIRUBINUR", "KETONESUR", "PROTEINUR", "UROBILINOGEN", "NITRITE", "LEUKOCYTESUR" in the last 168 hours.  Invalid input(s): "APPERANCEUR" Microbiology:  Results for orders placed or performed during the hospital encounter of 01/04/23  Respiratory (~20 pathogens) panel by PCR  Status: None   Collection Time: 01/04/23  7:20 PM   Specimen: Nasopharyngeal Swab; Respiratory  Result Value Ref Range Status   Adenovirus NOT DETECTED NOT DETECTED Final   Coronavirus 229E NOT DETECTED NOT DETECTED Final    Comment: (NOTE) The Coronavirus on the Respiratory Panel, DOES NOT test for the novel  Coronavirus (2019 nCoV)    Coronavirus HKU1 NOT DETECTED NOT DETECTED Final   Coronavirus NL63 NOT DETECTED NOT DETECTED Final   Coronavirus OC43 NOT DETECTED NOT DETECTED Final   Metapneumovirus NOT DETECTED NOT DETECTED Final   Rhinovirus / Enterovirus NOT DETECTED NOT DETECTED Final   Influenza A NOT DETECTED NOT DETECTED Final   Influenza B NOT DETECTED NOT DETECTED Final   Parainfluenza Virus 1 NOT DETECTED NOT DETECTED Final   Parainfluenza Virus 2 NOT DETECTED NOT DETECTED Final   Parainfluenza Virus 3 NOT DETECTED NOT DETECTED Final   Parainfluenza Virus 4 NOT DETECTED NOT DETECTED Final   Respiratory Syncytial Virus NOT DETECTED NOT DETECTED Final   Bordetella pertussis NOT DETECTED NOT DETECTED Final   Bordetella Parapertussis NOT DETECTED NOT DETECTED Final   Chlamydophila pneumoniae NOT DETECTED NOT DETECTED Final   Mycoplasma pneumoniae NOT DETECTED NOT DETECTED Final    Comment: Performed at [facility], [address], [place], [place] [address]   Lipid Panel:      Component Value Date/Time   CHOL 187 01/05/2023 0333   CHOL 271 (H) 06/11/2022 1407   TRIG 176 (H) 01/05/2023 0333   HDL 28 (L) 01/05/2023 0333   HDL 32 (L) 06/11/2022 1407   CHOLHDL 6.7 01/05/2023 0333   VLDL 35 01/05/2023 0333   LDLCALC 124 (H) 01/05/2023 0333   LDLCALC 184 (H) 06/11/2022 1407   HgbA1c:  Lab Results  Component Value Date   HGBA1C 5.9 (H) 01/05/2023   Urine Drug Screen: No results found for: "LABOPIA", "COCAINSCRNUR", "LABBENZ", "AMPHETMU", "THCU", "LABBARB"  Alcohol Level: No results for input(s): "ETH" in the last 168 hours.  ECHOCARDIOGRAM COMPLETE  Result Date: 01/05/2023    ECHOCARDIOGRAM REPORT   Patient Name:   Frank Hayden Date of Exam: 01/05/2023 Medical Rec #:  [id number]       Height:       73.0 in Accession #:    [id number]      Weight:       175.0 lb Date of Birth:  07/21/1956       BSA:          2.033 m Patient Age:    66 years        BP:           131/68 mmHg Patient Gender: M               HR:           61 bpm. Exam Location:  Inpatient Procedure: 2D Echo, Cardiac Doppler and Color Doppler Indications:    TIA G45.9  History:        Patient has no prior history of Echocardiogram examinations.                 TIA. Cancer (HCC) (From Hx).  Sonographer:    Celesta Gentile RCS Referring Phys: [id number] DAWOOD S Randol Kern  Sonographer Comments: Technically difficult study due to poor echo windows. IMPRESSIONS  1. Left ventricular ejection fraction, by estimation, is 60 to 65%. The left ventricle has normal function. The left ventricle has no regional wall motion abnormalities. There is mild  left ventricular hypertrophy. Left ventricular diastolic parameters are consistent with Grade I diastolic dysfunction (impaired relaxation).  2. Right ventricular systolic function is normal. The right ventricular size is normal. There is normal pulmonary artery systolic pressure. The estimated right ventricular systolic pressure is 26.8 mmHg.  3. The mitral valve is grossly normal. No  evidence of mitral valve regurgitation.  4. The aortic valve was not well visualized. Aortic valve regurgitation is not visualized.  5. The inferior vena cava is normal in size with greater than 50% respiratory variability, suggesting right atrial pressure of 3 mmHg. FINDINGS  Left Ventricle: Left ventricular ejection fraction, by estimation, is 60 to 65%. The left ventricle has normal function. The left ventricle has no regional wall motion abnormalities. Definity contrast agent was given IV to delineate the left ventricular  endocardial borders. The left ventricular internal cavity size was normal in size. There is mild left ventricular hypertrophy. Left ventricular diastolic parameters are consistent with Grade I diastolic dysfunction (impaired relaxation). Right Ventricle: The right ventricular size is normal. Right ventricular systolic function is normal. There is normal pulmonary artery systolic pressure. The tricuspid regurgitant velocity is 2.44 m/s, and with an assumed right atrial pressure of 3 mmHg,  the estimated right ventricular systolic pressure is 26.8 mmHg. Left Atrium: Left atrial size was normal in size. Right Atrium: Right atrial size was normal in size. Pericardium: There is no evidence of pericardial effusion. Mitral Valve: The mitral valve is grossly normal. No evidence of mitral valve regurgitation. Tricuspid Valve: Tricuspid valve regurgitation is not demonstrated. Aortic Valve: The aortic valve was not well visualized. Aortic valve regurgitation is not visualized. Pulmonic Valve: Pulmonic valve regurgitation is trivial. Aorta: The aortic root and ascending aorta are structurally normal, with no evidence of dilitation. Venous: The inferior vena cava is normal in size with greater than 50% respiratory variability, suggesting right atrial pressure of 3 mmHg. IAS/Shunts: The interatrial septum was not well visualized.  LEFT VENTRICLE PLAX 2D LVIDd:         4.90 cm   Diastology LVIDs:          3.05 cm   LV e' medial:    5.66 cm/s LV PW:         1.25 cm   LV E/e' medial:  10.8 LV IVS:        1.05 cm   LV e' lateral:   5.77 cm/s LVOT diam:     2.50 cm   LV E/e' lateral: 10.6 LV SV:         76 LV SV Index:   37 LVOT Area:     4.91 cm  RIGHT VENTRICLE RV S prime:     17.30 cm/s TAPSE (M-mode): 2.3 cm LEFT ATRIUM             Index        RIGHT ATRIUM           Index LA diam:        2.90 cm 1.43 cm/m   RA Area:     14.20 cm LA Vol (A2C):   24.3 ml 11.95 ml/m  RA Volume:   36.50 ml  17.95 ml/m LA Vol (A4C):   14.2 ml 6.98 ml/m LA Biplane Vol: 19.5 ml 9.59 ml/m  AORTIC VALVE LVOT Vmax:   92.00 cm/s LVOT Vmean:  58.300 cm/s LVOT VTI:    0.155 m  AORTA Ao Root diam: 3.80 cm MITRAL VALVE  TRICUSPID VALVE MV Area (PHT): 2.26 cm    TR Peak grad:   23.8 mmHg MV Decel Time: 335 msec    TR Vmax:        244.00 cm/s MV E velocity: 61.30 cm/s MV A velocity: 96.80 cm/s  SHUNTS MV E/A ratio:  0.63        Systemic VTI:  0.16 m                            Systemic Diam: 2.50 cm Carolan Clines Electronically signed by Carolan Clines Signature Date/Time: 01/05/2023/4:14:35 PM    Final    MR BRAIN WO CONTRAST  Result Date: 01/05/2023 CLINICAL DATA:  Transient ischemic attack EXAM: MRI HEAD WITHOUT CONTRAST TECHNIQUE: Multiplanar, multiecho pulse sequences of the brain and surrounding structures were obtained without intravenous contrast. COMPARISON:  10/09/2022 FINDINGS: Brain: Small acute infarct of the right caudate head. Chronic microhemorrhage in the right frontal lobe. Large area of encephalomalacia of the anterior right frontal lobe and the right temporal lobe, unchanged. Old right parietal infarct is also unchanged. Persistent dilatation of the lateral ventricles. Lack of intravenous contrast agent limits assessment of known right tentorial meningioma. Based on T2-weighted imaging, it appears unchanged. The midline structures are normal. Vascular: Loss of the proximal right MCA flow void, unchanged. Skull  and upper cervical spine: Remote right craniotomy. Sinuses/Orbits:Opacification of the left maxillary sinus. Normal orbits. IMPRESSION: 1. Small acute infarct of the right caudate head. No hemorrhage or mass effect. 2. Unchanged large area of encephalomalacia of the anterior right frontal lobe and the right temporal lobe. 3. Lack of intravenous contrast agent limits assessment of known right tentorial meningioma. Based on T2-weighted imaging, it appears unchanged. Electronically Signed   By: Deatra Robinson M.D.   On: 01/05/2023 01:59   CT ANGIO HEAD NECK W WO CM  Result Date: 01/05/2023 CLINICAL DATA:  Transient ischemic attack EXAM: CT ANGIOGRAPHY HEAD AND NECK WITH AND WITHOUT CONTRAST TECHNIQUE: Multidetector CT imaging of the head and neck was performed using the standard protocol during bolus administration of intravenous contrast. Multiplanar CT image reconstructions and MIPs were obtained to evaluate the vascular anatomy. Carotid stenosis measurements (when applicable) are obtained utilizing NASCET criteria, using the distal internal carotid diameter as the denominator. RADIATION DOSE REDUCTION: This exam was performed according to the departmental dose-optimization program which includes automated exposure control, adjustment of the mA and/or kV according to patient size and/or use of iterative reconstruction technique. CONTRAST:  75mL OMNIPAQUE IOHEXOL 350 MG/ML SOLN COMPARISON:  Brain MRI 10/06/2021, 01/07/2023 FINDINGS: CTA NECK FINDINGS SKELETON: Multilevel degenerative disc disease and facet hypertrophy without bony spinal canal stenosis. OTHER NECK: Normal pharynx, larynx and major salivary glands. No cervical lymphadenopathy. Unremarkable thyroid gland. UPPER CHEST: No pneumothorax or pleural effusion. No nodules or masses. AORTIC ARCH: There is no calcific atherosclerosis of the aortic arch. There is no aneurysm, dissection or hemodynamically significant stenosis of the visualized portion of the  aorta. Conventional 3 vessel aortic branching pattern. The visualized proximal subclavian arteries are widely patent. RIGHT CAROTID SYSTEM: Normal without aneurysm, dissection or stenosis. LEFT CAROTID SYSTEM: Normal without aneurysm, dissection or stenosis. VERTEBRAL ARTERIES: Left dominant configuration. Both origins are clearly patent. There is no dissection, occlusion or flow-limiting stenosis to the skull base (V1-V3 segments). CTA HEAD FINDINGS POSTERIOR CIRCULATION: --Vertebral arteries: Normal V4 segments. --Inferior cerebellar arteries: Normal. --Basilar artery: Normal. --Superior cerebellar arteries: Normal. --Posterior cerebral arteries (PCA): Normal. ANTERIOR CIRCULATION: --  Intracranial internal carotid arteries: There is mild stenosis of the right ICA at the skull base. The left is widely patent. --Anterior cerebral arteries (ACA): Normal. Both A1 segments are present. Patent anterior communicating artery (a-comm). --Middle cerebral arteries (MCA): The right MCA proximal M1 segment is occluded, unchanged compared to postcontrast images from 10/09/2022 brain MRI. The left MCA is patent. There is multifocal mild stenosis. VENOUS SINUSES: As permitted by contrast timing, patent. ANATOMIC VARIANTS: None Review of the MIP images confirms the above findings. IMPRESSION: 1. Occlusion of the right MCA proximal M1 segment, unchanged compared to postcontrast images from 10/09/2022 brain MRI. 2. Mild stenosis of the right ICA at the skull base. 3. No hemodynamically significant stenosis of the neck. Electronically Signed   By: Deatra Robinson M.D.   On: 01/05/2023 01:39   CT Head Wo Contrast  Result Date: 01/04/2023 CLINICAL DATA:  Headaches and difficulty speaking EXAM: CT HEAD WITHOUT CONTRAST TECHNIQUE: Contiguous axial images were obtained from the base of the skull through the vertex without intravenous contrast. RADIATION DOSE REDUCTION: This exam was performed according to the departmental  dose-optimization program which includes automated exposure control, adjustment of the mA and/or kV according to patient size and/or use of iterative reconstruction technique. COMPARISON:  07/27/2021, MRI from 10/09/2022 FINDINGS: Brain: Postsurgical changes are noted in the right temporal lobe with considerable encephalomalacia. No findings to suggest acute hemorrhage or acute infarction are seen. Persistent density is noted along the inferior aspect of the right tentorium centrally similar to that seen on prior MRI examination consistent with the known meningioma. The more supra tentorial component is not as well visualized due to the lack of IV contrast. Vascular: No hyperdense vessel or unexpected calcification. Skull: Postsurgical changes are noted in the right frontal parietal region. Additionally changes are noted along the anterior and posterior portions of the zygomatic arch on right. Sinuses/Orbits: Orbits and their contents are within normal limits. Air-fluid level is noted within the left maxillary antrum new from the prior exam. Other: None. IMPRESSION: Postsurgical changes in the right temporal region as described. Stable appearing meningioma at along the right tentorium anteriorly. No acute intracranial abnormality is seen. New air-fluid level within the left maxillary antrum which may represent acute sinusitis. Electronically Signed   By: Alcide Clever M.D.   On: 01/04/2023 17:44   DG Chest Port 1 View  Result Date: 01/04/2023 CLINICAL DATA:  Shortness of breath EXAM: PORTABLE CHEST 1 VIEW COMPARISON:  CXR 12/31/22 FINDINGS: Low lung volumes. No pleural effusion. No pneumothorax. No focal airspace opacity. Normal cardiac and mediastinal contours. No radiographically apparent displaced rib fractures. Visualized upper abdomen is unremarkable. IMPRESSION: No focal airspace opacity. Electronically Signed   By: Lorenza Cambridge M.D.   On: 01/04/2023 17:25   DG Chest 2 View  Result Date:  01/03/2023 CLINICAL DATA:  Cough, shortness of breath. EXAM: CHEST - 2 VIEW COMPARISON:  None Available. FINDINGS: The heart size and mediastinal contours are within normal limits. Minimal to mild bibasilar subsegmental atelectasis is noted. The visualized skeletal structures are unremarkable. IMPRESSION: Minimal to mild bibasilar subsegmental atelectasis. Electronically Signed   By: Lupita Raider M.D.   On: 01/03/2023 13:37     EKG: normal EKG, normal sinus rhythm.   Physical Examination: Temp:  [97.3 F (36.3 C)-98.5 F (36.9 C)] 98.4 F (36.9 C) (05/12 1203) Pulse Rate:  [53-71] 71 (05/12 1203) Resp:  [16-19] 18 (05/12 1203) BP: (113-148)/(58-85) 113/58 (05/12 1203) SpO2:  [92 %-100 %] 92 % (  05/12 1203)  General - Well nourished, well developed, in no apparent distress.  Mental Status -  Level of arousal and orientation to time, place, and person were intact. Speech was noted to be clear and fluent Attention span and concentration were normal. Recent and remote memory were intact. Fund of Knowledge was assessed and was intact.  Cranial Nerves II - XII - II -some diminished lateral peripheral vision noted in right eye only, visual fields otherwise intact III, IV, VI - Extraocular movements intact on the left, right eye fixed in midline position V - Facial sensation intact bilaterally. VII - Facial movement intact bilaterally. VIII - Hearing & vestibular intact bilaterally. X -donation is normal XII - Tongue deviates slightly to the right  Motor Strength - The patient's strength was normal in all extremities and pronator drift was absent.  Bulk was normal and fasciculations were absent.   Motor Tone - Muscle tone was assessed at the neck and appendages and was normal.  Sensory - Light touch was assessed and found to be symmetrical  Coordination - The patient had normal movements in the hands and feet with no ataxia or dysmetria.  Tremor was absent.  Gait and Station -  deferred.   Assessment:  [name] is a 67 y.o. male with history of chronic sinusitis acute sinusitis and meningioma behind the right eye status postsurgery and radiation presenting with transient inability to speak, due to coughing spell.  Right caudate head infarct was found on MRI.  Patient has extensive history of radiation therapy to the right side of his head, and stroke was likely caused by small vessel changes due to radiation and appeared to produce no symptoms.  Stroke, incidental finding:  right caudate head infarct secondary to small vessel disease postradiation therapy CT head postsurgical changes in right temporal region status post meningioma resection, stable appearing meningioma along right tentorium, air-fluid level in left maxillary antrum and no acute or cranial abnormality CTA head & neck occlusion of right proximal M1 segment, unchanged from February, mild stenosis of right ICA at skull base MRI small acute infarct of right caudate head, unchanged large area of encephalomalacia of anterior right frontal lobe and temporal lobe 2D Echo EF 60 to 65%, grade 1 diastolic dysfunction, mild LVH, normal left atrial size, interatrial septum not well-visualized LDL 124 HgbA1c 5.9  Lovenox for VTE prophylaxis No antithrombotic prior to admission, now on aspirin 81 mg daily and clopidogrel 75 mg daily for 3 weeks followed by aspirin alone. Therapy recommendations: Pending Disposition: Pending, likely home  Speech arrest After a bouts of severe cough Pt reported that he did not have aphasia but trying to catch his breathing before talking No concern of left brain on MRI (pt is right handed)  Hypertension Stable Long-term BP goal normotensive  Hyperlipidemia Home meds: Rosuvastatin 5 mg daily, increase to 20 mg LDL 124, goal < 70 Continue statin at discharge  Other Stroke Risk Factors Advanced age Former cigarette smoker  Other Active Problems History of  meningioma on the right side status post surgical resection and radiation therapy-follow-up with outpatient oncology provider  Hospital day # 0   Thank you for this consultation and allowing [place] to participate in the care of this patient.  Cortney E Ernestina Columbia , MSN, AGACNP-BC [facility] See Amion for schedule and pager information 01/06/2023 12:58 PM   ATTENDING NOTE: I reviewed above note and agree with the assessment and plan. Pt was seen and examined.  Daughter at bedside.  Patient sitting at edge of bed, reported that yesterday he had speech arrest after a bout of severe coughing, he denies any aphasia but reported trying to catch his breathing after coughing before talking.  He is right-handed, there is no concern on the MRI on the left.  However MRI did not show right caudate head incidental infarct, likely due to previous radiotherapy for right meningioma.  On exam, patient still has residual right ptosis, right fixed eye movement and decreased visual acuity with only able to see finger counting.  Mild right facial droop, but otherwise neuro intact.  Recommend DAPT for 3 weeks and then aspirin alone, increase Crestor from 5-20.  PT and OT pending.  For detailed assessment and plan, please refer to above/below as I have made changes wherever appropriate.   Neurology will sign off. Please call with questions. Pt will follow up with stroke clinic NP at [facility] in about 4 weeks. Thanks for the consult.   Marvel Plan, MD PhD Stroke Neurology 01/06/2023 7:07 PM    To contact Stroke Continuity provider, please refer to [email or web address]. After hours, contact General Neurology

## 2023-01-06 NOTE — Plan of Care (Signed)
  Problem: Education: Goal: Knowledge of General Education information will improve Description: Including pain rating scale, medication(s)/side effects and non-pharmacologic comfort measures Outcome: Adequate for Discharge   Problem: Health Behavior/Discharge Planning: Goal: Ability to manage health-related needs will improve Outcome: Adequate for Discharge   Problem: Clinical Measurements: Goal: Ability to maintain clinical measurements within normal limits will improve Outcome: Adequate for Discharge Goal: Will remain free from infection Outcome: Adequate for Discharge Goal: Diagnostic test results will improve Outcome: Adequate for Discharge Goal: Respiratory complications will improve Outcome: Adequate for Discharge Goal: Cardiovascular complication will be avoided Outcome: Adequate for Discharge   Problem: Activity: Goal: Risk for activity intolerance will decrease Outcome: Adequate for Discharge   Problem: Nutrition: Goal: Adequate nutrition will be maintained Outcome: Adequate for Discharge   Problem: Coping: Goal: Level of anxiety will decrease Outcome: Adequate for Discharge   Problem: Elimination: Goal: Will not experience complications related to bowel motility Outcome: Adequate for Discharge Goal: Will not experience complications related to urinary retention Outcome: Adequate for Discharge   Problem: Pain Managment: Goal: General experience of comfort will improve Outcome: Adequate for Discharge   Problem: Safety: Goal: Ability to remain free from injury will improve Outcome: Adequate for Discharge   Problem: Skin Integrity: Goal: Risk for impaired skin integrity will decrease Outcome: Adequate for Discharge   Problem: Education: Goal: Knowledge of disease or condition will improve Outcome: Adequate for Discharge Goal: Knowledge of secondary prevention will improve (MUST DOCUMENT ALL) Outcome: Adequate for Discharge Goal: Knowledge of patient  specific risk factors will improve (Mark N/A or DELETE if not current risk factor) Outcome: Adequate for Discharge   Problem: Ischemic Stroke/TIA Tissue Perfusion: Goal: Complications of ischemic stroke/TIA will be minimized Outcome: Adequate for Discharge   Problem: Coping: Goal: Will verbalize positive feelings about self Outcome: Adequate for Discharge Goal: Will identify appropriate support needs Outcome: Adequate for Discharge   Problem: Health Behavior/Discharge Planning: Goal: Ability to manage health-related needs will improve Outcome: Adequate for Discharge Goal: Goals will be collaboratively established with patient/family Outcome: Adequate for Discharge   Problem: Self-Care: Goal: Ability to participate in self-care as condition permits will improve Outcome: Adequate for Discharge Goal: Verbalization of feelings and concerns over difficulty with self-care will improve Outcome: Adequate for Discharge Goal: Ability to communicate needs accurately will improve Outcome: Adequate for Discharge   Problem: Nutrition: Goal: Risk of aspiration will decrease Outcome: Adequate for Discharge Goal: Dietary intake will improve Outcome: Adequate for Discharge   

## 2023-01-06 NOTE — Discharge Summary (Signed)
DISCHARGE SUMMARY  Frank Hayden  MR#: [id number]  DOB:Oct 28, 1955  Date of Admission: 01/04/2023 Date of Discharge: 01/06/2023  Attending Physician:Bryant Saye Silvestre Gunner, MD  Patient's QIO:NGEXB, Edilia Bo, FNP  Consults: Stroke Team  Disposition: D/C home   Follow-up Appts:  Follow-up Information     [name], FNP Follow up in 1 week(s).   Specialty: Family Medicine Contact information: [address] [place] [address] [phone]                 Tests Needing Follow-up: -Lipid level should be rechecked in approximately [redacted] weeks along with LFTs to assure patient is tolerating increased dose of Crestor  Discharge Diagnoses: Acute CVA Known meningioma of the right sphenoid wing involving the cavernous sinus Acute on chronic bacterial sinusitis  Hyperlipidemia  Initial presentation: 67 year old with a history of chronic sinusitis who is status post meningioma resection with resultant mild residual right extremity weakness, tongue deviation, and ptosis who has been undergoing treatment for recurrent sinusitis as an outpatient over the last week. On 01/04/2023 he experienced a coughing spell at home after which he was acutely unable to speak. There were no other focal neurologic deficits. This episode lasted for about 8 minutes. He was brought to the ER at [facility] where CT head was without acute findings. The decision was made to admit the patient for a CVA workup, but due to the fact that the MRI scanner at [facility] is broken the patient had to be transferred to [facility]   Hospital Course:  Acute CVA -Resultant transient expressive aphasia -MRI brain revealed a small acute infarct of the right caudate head as well as an unchanged large area of encephalomalacia of the anterior right frontal lobe and right temporal lobes -CTa head and neck noted occlusion of the right MCA proximal M1 segment unchanged compared to postcontrast images from  10/09/2022 MRI as well as mild stenosis of the right ICA at the skull base but no hemodynamically significant stenosis in the neck -TTE noted EF 60-65% with no WMA and grade 1 DD and no apparent valvular abnormalities or shunt -medical treatment: on no prior plt meds - Stroke Team recommended aspirin plus Plavix for 3 weeks then aspirin alone -LDL 124 -A1c 5.9 -PT/OT suggest no further evaluation needed -SLP following -Allow for permissive hypertension   Acute on chronic sinusitis Continue cefdinir which was initiated in outpatient setting  Hyperlipidemia Given elevated LDL at 124 the patient's Crestor dose was increased to 20 mg   Known meningioma of the right sphenoid wing involving the cavernous sinus Previously underwent gamma knife procedures in 2005 and 2016 with resection of a large portion of this tumor, preceded by open surgery in 2000 (right frontal craniotomy for resection of right cavernous sinus tumor) - remnant appears to be stable on follow-up imaging this admission  Allergies as of 01/06/2023       Reactions   Atorvastatin Other (See Comments)   Muscle aches   Carbamazepine Nausea And Vomiting   Chest tightness Chest tightness   Pregabalin Nausea And Vomiting   Augmentin [amoxicillin-pot Clavulanate] Diarrhea   Latex Dermatitis        Medication List     TAKE these medications    acetaminophen 500 MG tablet Commonly known as: TYLENOL Take 1 tablet (500 mg total) by mouth every 6 (six) hours as needed.   albuterol 108 (90 Base) MCG/ACT inhaler Commonly known as: VENTOLIN HFA Inhale 2 puffs into the  lungs every 6 (six) hours as needed for wheezing or shortness of breath.   ALPRAZolam 1 MG tablet Commonly known as: XANAX Take 1 tablet (1 mg total) by mouth at bedtime as needed for anxiety.   aspirin EC 81 MG tablet Take 1 tablet (81 mg total) by mouth daily. Swallow whole. Start taking on: Jan 07, 2023   benzonatate 200 MG capsule Commonly known as:  TESSALON Take 1 capsule (200 mg total) by mouth 2 (two) times daily as needed for cough.   Bepotastine Besilate 1.5 % Soln   cefdinir 300 MG capsule Commonly known as: OMNICEF Take 1 capsule (300 mg total) by mouth 2 (two) times daily. 1 po BID What changed: additional instructions   clopidogrel 75 MG tablet Commonly known as: PLAVIX Take 1 tablet (75 mg total) by mouth daily.   cyanocobalamin 1000 MCG tablet Commonly known as: VITAMIN B12 Take 1,000 mcg by mouth daily.   cycloSPORINE 0.05 % ophthalmic emulsion Commonly known as: RESTASIS 1 drop 2 (two) times daily.   fluticasone 50 MCG/ACT nasal spray Commonly known as: FLONASE Place 2 sprays into both nostrils daily.   levocetirizine 5 MG tablet Commonly known as: XYZAL Take 5 mg by mouth every evening.   Miebo 1.338 GM/ML Soln Generic drug: Perfluorohexyloctane Apply 1 drop to eye 2 (two) times daily.   OVER THE COUNTER MEDICATION Place 1 drop into the right eye 4 (four) times daily. Losartan potassium (PF) (0.5 ml) (0.8 mg/ml) oph soln     Pt spouse states this eye drop was compounded for pt.   Potassium 99 MG Tabs Take 1 tablet by mouth daily.   predniSONE 10 MG (21) Tbpk tablet Commonly known as: STERAPRED UNI-PAK 21 TAB Use as directed   rosuvastatin 20 MG tablet Commonly known as: CRESTOR Take 1 tablet (20 mg total) by mouth daily. Start taking on: Jan 07, 2023 What changed:  medication strength how much to take   triamcinolone ointment 0.5 % Commonly known as: KENALOG Apply 1 Application topically 2 (two) times daily.   Tyrvaya 0.03 MG/ACT Soln Generic drug: Varenicline Tartrate Place 1 spray into both nostrils 2 (two) times daily.   Vitamin D 50 MCG (2000 UT) Caps Take by mouth.   vitamin E 180 MG (400 UNITS) capsule Take 400 Units by mouth daily.   ZINC 15 PO Take by mouth.        Day of Discharge BP (!) 113/58 (BP Location: Left Arm)   Pulse 71   Temp 98.4 F (36.9 C) (Oral)    Resp 18   Ht 6\' 1"  (1.854 m)   Wt 79.4 kg   SpO2 92%   BMI 23.09 kg/m   Physical Exam: General: No acute respiratory distress Lungs: Clear to auscultation bilaterally without wheezes or crackles Cardiovascular: Regular rate and rhythm without murmur gallop or rub normal S1 and S2 Abdomen: Nontender, nondistended, soft, bowel sounds positive, no rebound, no ascites, no appreciable mass Extremities: No significant cyanosis, clubbing, or edema bilateral lower extremities  Basic Metabolic Panel: Recent Labs  Lab 01/04/23 1650  NA 135  K 4.0  CL 102  CO2 23  GLUCOSE 121*  BUN 19  CREATININE 1.05  CALCIUM 8.7*    CBC: Recent Labs  Lab 01/04/23 1650  WBC 17.9*  NEUTROABS 15.8*  HGB 14.2  HCT 41.4  MCV 87.2  PLT 294    Time spent in discharge (includes decision making & examination of pt): 35 minutes  01/06/2023, 1:51 PM  Cherene Altes, MD [facility] Office  [phone]

## 2023-01-06 NOTE — Progress Notes (Signed)
This RN was told that patient has meds in his bag, this RN tried to take them to Pharmacy but patient refused. He said he will give them to his wife to take home in the morning. Day shift nurse made aware.

## 2023-01-06 NOTE — Progress Notes (Signed)
Discharge instructions given, patient discharged, dropping to the main entrance A to the sister's car.

## 2023-01-07 ENCOUNTER — Telehealth: Payer: Self-pay | Admitting: Family

## 2023-01-07 NOTE — Telephone Encounter (Signed)
This is fine with me.   [name], FNP

## 2023-01-07 NOTE — Telephone Encounter (Signed)
Patient had a cough and was unable to catch his breath on Friday 5/10, when trying to communicate with wife she was unable to understand him so she called 911, he was admitted to the hospital on 5/10 to [facility] and transferred to [facility] to do more testing. Saturday 5/11, they were told he had a stroke. Discharged 5/12 - wife said that patient was in the process of switching from [facility] to [facility] and did not know who she would need to see for hospital follow up. Aware that these providers may not have anything for him to be seen for this appt. Please call back and advise.

## 2023-01-07 NOTE — Telephone Encounter (Signed)
Waiting for [facility]

## 2023-01-07 NOTE — Telephone Encounter (Signed)
Made appt for 5/14 with DOD per nurse - patient would still like to switch providers from [facility] to [facility]. Please call back to let them know if this is possible.

## 2023-01-08 ENCOUNTER — Ambulatory Visit (INDEPENDENT_AMBULATORY_CARE_PROVIDER_SITE_OTHER): Payer: BC Managed Care – PPO | Admitting: Nurse Practitioner

## 2023-01-08 ENCOUNTER — Encounter: Payer: Self-pay | Admitting: Nurse Practitioner

## 2023-01-08 VITALS — BP 129/72 | HR 52 | Temp 97.1°F | Resp 20 | Ht 73.0 in | Wt 180.0 lb

## 2023-01-08 DIAGNOSIS — I6930 Unspecified sequelae of cerebral infarction: Secondary | ICD-10-CM | POA: Diagnosis not present

## 2023-01-08 DIAGNOSIS — Z09 Encounter for follow-up examination after completed treatment for conditions other than malignant neoplasm: Secondary | ICD-10-CM | POA: Diagnosis not present

## 2023-01-08 NOTE — Progress Notes (Signed)
   Subjective:    Patient ID: Frank Hayden, male    DOB: October 21, 1955, 67 y.o.   MRN: [id number]   Chief Complaint: Hospitalization Follow-up   HPI  Patient went to the hospital 01/04/23 and was dx as having a CVA. Was admitted to hospital. Was discharged home on plavix. He is to have labs repeated in 8 weeks. He says he is doing well.  Patient had meningioma removed from right side of face years ago and has right sided paralysis anyway. Patient Active Problem List   Diagnosis Date Noted   TIA (transient ischemic attack) 01/04/2023   GAD (generalized anxiety disorder) 09/11/2022   Myalgia due to statin 06/14/2022   Benzodiazepine dependence (HCC) 02/19/2020   Primary insomnia 11/18/2019   Controlled substance agreement signed 11/18/2019   Sensorineural hearing loss (SNHL), bilateral 10/03/2018   Spinal stenosis in cervical region 09/19/2018   Chronic sinusitis 06/20/2018   Meningioma (HCC) 08/12/2014   Meningioma of right sphenoid wing involving cavernous sinus (HCC) 08/12/2014       Review of Systems  Constitutional:  Negative for diaphoresis.  Eyes:  Negative for pain.  Respiratory:  Negative for shortness of breath.   Cardiovascular:  Negative for chest pain, palpitations and leg swelling.  Gastrointestinal:  Negative for abdominal pain.  Endocrine: Negative for polydipsia.  Skin:  Negative for rash.  Neurological:  Negative for dizziness, weakness and headaches.  Hematological:  Does not bruise/bleed easily.  All other systems reviewed and are negative.      Objective:   Physical Exam Vitals reviewed.  Constitutional:      Appearance: Normal appearance.  Cardiovascular:     Rate and Rhythm: Normal rate and regular rhythm.     Pulses: Normal pulses.     Heart sounds: Normal heart sounds.  Pulmonary:     Breath sounds: Normal breath sounds.  Neurological:     General: No focal deficit present.     Mental Status: He is alert and oriented to person, place, and  time.     Cranial Nerves: Cranial nerve deficit (right sided facial paralysis- old surgery) present.     BP 129/72   Pulse (!) 52   Temp (!) 97.1 F (36.2 C) (Temporal)   Resp 20   Ht 6\' 1"  (1.854 m)   Wt 180 lb (81.6 kg)   SpO2 100%   BMI 23.75 kg/m        Assessment & Plan:   Frank Hayden in today with chief complaint of Hospitalization Follow-up   1. Late effect of cerebrovascular accident (CVA) No permanent effects Report any changes Report any issues with plavix. Needs repeat labs in 6 weeks  2. Hospital discharge follow-up Hospital records reviewed    The above assessment and management plan was discussed with the patient. The patient verbalized understanding of and has agreed to the management plan. Patient is aware to call the clinic if symptoms persist or worsen. Patient is aware when to return to the clinic for a follow-up visit. Patient educated on when it is appropriate to go to the emergency department.   Frank Daphine Deutscher, FNP

## 2023-01-15 ENCOUNTER — Telehealth: Payer: Self-pay | Admitting: Family

## 2023-01-15 NOTE — Telephone Encounter (Signed)
Wife calling to request provider change. Wants Dr [facility] to be patients PCP.  Does Biomedical engineer both agree to this?

## 2023-01-16 NOTE — Telephone Encounter (Signed)
Pt's wife is aware of provider feedback and voiced understanding. She states pt isn't going to see [facility] and she may have to look for another provider at a different facility.

## 2023-01-16 NOTE — Telephone Encounter (Signed)
Prescribed a controlled substance, according to office policy, cannot switch providers while on controlled substance

## 2023-01-18 NOTE — Telephone Encounter (Signed)
This is fine with me.   Fatma Rutten, FNP  

## 2023-01-18 NOTE — Telephone Encounter (Signed)
Left message for pt to return call.

## 2023-01-18 NOTE — Telephone Encounter (Signed)
Pt does not want to continue seeing [facility]. He does not want to leave the practice. Pt states that he takes Xanax to help with sleep not for anxiety.  Pt would really like to see [facility]. He states that he does not feel safe with [facility].  Do you have any other recommendations?  He does have a referral to neuro for recent stroke.

## 2023-01-18 NOTE — Telephone Encounter (Signed)
Patient is on Xanax, controlled substance, per office policy we cannot make the change

## 2023-01-18 NOTE — Telephone Encounter (Signed)
Unfortunately our office policy is that if somebody is on a controlled substance such as Xanax they cannot change providers because of the controlled substance contract.

## 2023-01-22 NOTE — Telephone Encounter (Signed)
Patient called back to speak with myself about switching.  I informed the patient that we could not switch him at this time because he is on a controlled substance.  He understood and said he would see what he needs to do next.  I explained our current policy about the controlled medications and the contract.

## 2023-01-25 ENCOUNTER — Other Ambulatory Visit: Payer: Self-pay | Admitting: Family Medicine

## 2023-01-25 DIAGNOSIS — I6930 Unspecified sequelae of cerebral infarction: Secondary | ICD-10-CM

## 2023-02-04 DIAGNOSIS — H04121 Dry eye syndrome of right lacrimal gland: Secondary | ICD-10-CM | POA: Diagnosis not present

## 2023-02-04 DIAGNOSIS — H16211 Exposure keratoconjunctivitis, right eye: Secondary | ICD-10-CM | POA: Diagnosis not present

## 2023-02-04 DIAGNOSIS — H02411 Mechanical ptosis of right eyelid: Secondary | ICD-10-CM | POA: Diagnosis not present

## 2023-02-04 DIAGNOSIS — H16231 Neurotrophic keratoconjunctivitis, right eye: Secondary | ICD-10-CM | POA: Diagnosis not present

## 2023-02-19 ENCOUNTER — Other Ambulatory Visit: Payer: Medicare Other

## 2023-02-19 DIAGNOSIS — I6930 Unspecified sequelae of cerebral infarction: Secondary | ICD-10-CM | POA: Diagnosis not present

## 2023-02-19 LAB — COMPREHENSIVE METABOLIC PANEL
ALT: 19 IU/L (ref 0–44)
AST: 18 IU/L (ref 0–40)
Albumin: 4.6 g/dL (ref 3.9–4.9)
Alkaline Phosphatase: 70 IU/L (ref 44–121)
BUN/Creatinine Ratio: 12 (ref 10–24)
BUN: 13 mg/dL (ref 8–27)
Bilirubin Total: 0.7 mg/dL (ref 0.0–1.2)
CO2: 23 mmol/L (ref 20–29)
Calcium: 9.6 mg/dL (ref 8.6–10.2)
Chloride: 104 mmol/L (ref 96–106)
Creatinine, Ser: 1.05 mg/dL (ref 0.76–1.27)
Globulin, Total: 2 g/dL (ref 1.5–4.5)
Glucose: 99 mg/dL (ref 70–99)
Potassium: 4.3 mmol/L (ref 3.5–5.2)
Sodium: 140 mmol/L (ref 134–144)
Total Protein: 6.6 g/dL (ref 6.0–8.5)
eGFR: 78 mL/min/{1.73_m2} (ref >59)

## 2023-02-19 LAB — LIPID PANEL
Chol/HDL Ratio: 4.2 ratio (ref 0.0–5.0)
Cholesterol, Total: 192 mg/dL (ref 100–199)
HDL: 46 mg/dL (ref >39)
LDL Chol Calc (NIH): 121 mg/dL — ABNORMAL HIGH (ref 0–99)
Triglycerides: 139 mg/dL (ref 0–149)
VLDL Cholesterol Cal: 25 mg/dL (ref 5–40)

## 2023-03-19 DIAGNOSIS — H168 Other keratitis: Secondary | ICD-10-CM | POA: Diagnosis not present

## 2023-03-19 DIAGNOSIS — H16231 Neurotrophic keratoconjunctivitis, right eye: Secondary | ICD-10-CM | POA: Diagnosis not present

## 2023-03-19 NOTE — Progress Notes (Unsigned)
[facility] [address] [place]. [place] [address] [phone]       HOSPITAL FOLLOW UP NOTE  [name] Date of Birth:  May 29, 1956 Medical Record Number:  [id number]   Reason for Referral:  hospital stroke follow up    SUBJECTIVE:   CHIEF COMPLAINT:  No chief complaint on file.   HPI:   Frank Hayden is an 67 y.o. Caucasian male with PMH of chronic sinusitis, acute sinusitis and meningioma behind his right eye which has been resected and has been treated with radiation several times who presented on 01/04/2023 after an episode in which he began coughing and was unable to catch his breath and was unable to speak for about 5 minutes.  Symptoms completely resolved.  MRI shows small infarct in right caudate head which does not explain patient's symptoms.  Per Dr. Roda Hayden, upon further questioning, patient states he was unable to speak because he could not catch his breath due to coughing.  Patient has an extensive history of radiation therapy to the right side of his head, and stroke was likely caused by small vessel disease in the setting of postradiation changes.  Recommended DAPT for 3 weeks followed by aspirin alone.  Increased home dose Crestor to 20 mg daily, LDL 124.        PERTINENT IMAGING  CT head postsurgical changes in right temporal region status post meningioma resection, stable appearing meningioma along right tentorium, air-fluid level in left maxillary antrum and no acute or cranial abnormality CTA head & neck occlusion of right proximal M1 segment, unchanged from February, mild stenosis of right ICA at skull base MRI small acute infarct of right caudate head, unchanged large area of encephalomalacia of anterior right frontal lobe and temporal lobe 2D Echo EF 60 to 65%, grade 1 diastolic dysfunction, mild LVH, normal left atrial size, interatrial septum not well-visualized LDL 124 HgbA1c 5.9     ROS:   14 system review of systems  performed and negative with exception of ***  PMH:  Past Medical History:  Diagnosis Date   Anxiety    Has headaches associated with past surgery.   Cancer (HCC)    Brain    Hearing loss    decreased 95% right / decreased 24% left ear    PSH:  Past Surgical History:  Procedure Laterality Date   APPENDECTOMY     EYE SURGERY Right    Tumor removed behind eye.   TONSILLECTOMY      Social History:  Social History   Socioeconomic History   Marital status: Married    Spouse name: Not on file   Number of children: Not on file   Years of education: Not on file   Highest education level: 12th grade  Occupational History   Not on file  Tobacco Use   Smoking status: Former   Smokeless tobacco: Never   Tobacco comments:    quit 40 years ago  Vaping Use   Vaping status: Never Used  Substance and Sexual Activity   Alcohol use: No   Drug use: No   Sexual activity: Not on file  Other Topics Concern   Not on file  Social History Narrative   Not on file   Social Determinants of Health   Financial Resource Strain: Low Risk  (12/10/2022)   Overall Financial Resource Strain (CARDIA)    Difficulty of Paying Living Expenses: Not hard at all  Food Insecurity: No Food Insecurity (01/05/2023)   Hunger  Vital Sign    Worried About Programme researcher, broadcasting/film/video in the Last Year: Never true    Ran Out of Food in the Last Year: Never true  Transportation Needs: No Transportation Needs (01/05/2023)   PRAPARE - Administrator, Civil Service (Medical): No    Lack of Transportation (Non-Medical): No  Physical Activity: Sufficiently Active (12/10/2022)   Exercise Vital Sign    Days of Exercise per Week: 5 days    Minutes of Exercise per Session: 60 min  Stress: No Stress Concern Present (12/10/2022)   [name] of Occupational Health - Occupational Stress Questionnaire    Feeling of Stress : Not at all  Social Connections: Moderately Integrated (12/10/2022)   Social Connection  and Isolation Panel [NHANES]    Frequency of Communication with Friends and Family: Three times a week    Frequency of Social Gatherings with Friends and Family: Twice a week    Attends Religious Services: More than 4 times per year    Active Member of [employer] or Organizations: No    Attends Engineer, structural: Not on file    Marital Status: Married  Catering manager Violence: Not At Risk (01/05/2023)   Humiliation, Afraid, Rape, and Kick questionnaire    Fear of Current or Ex-Partner: No    Emotionally Abused: No    Physically Abused: No    Sexually Abused: No    Family History:  Family History  Problem Relation Age of Onset   Heart disease Mother        Heart enlarged and mechanical valve surgery.   Hypertension Mother    Heart disease Father        5 bypass   Hypertension Father     Medications:   Current Outpatient Medications on File Prior to Visit  Medication Sig Dispense Refill   acetaminophen (TYLENOL) 500 MG tablet Take 1 tablet (500 mg total) by mouth every 6 (six) hours as needed. 30 tablet 0   albuterol (VENTOLIN HFA) 108 (90 Base) MCG/ACT inhaler Inhale 2 puffs into the lungs every 6 (six) hours as needed for wheezing or shortness of breath. 8 g 0   ALPRAZolam (XANAX) 1 MG tablet Take 1 tablet (1 mg total) by mouth at bedtime as needed for anxiety. 30 tablet 2   aspirin EC 81 MG tablet Take 1 tablet (81 mg total) by mouth daily. Swallow whole. 30 tablet 12   benzonatate (TESSALON) 200 MG capsule Take 1 capsule (200 mg total) by mouth 2 (two) times daily as needed for cough. 30 capsule 1   Bepotastine Besilate 1.5 % SOLN      cefdinir (OMNICEF) 300 MG capsule Take 1 capsule (300 mg total) by mouth 2 (two) times daily. 1 po BID (Patient taking differently: Take 300 mg by mouth 2 (two) times daily. 10 days supply) 20 capsule 0   Cholecalciferol (VITAMIN D) 2000 units CAPS Take by mouth.     clopidogrel (PLAVIX) 75 MG tablet Take 1 tablet (75 mg total) by mouth  daily. 20 tablet 0   cycloSPORINE (RESTASIS) 0.05 % ophthalmic emulsion 1 drop 2 (two) times daily.     fluticasone (FLONASE) 50 MCG/ACT nasal spray Place 2 sprays into both nostrils daily. 16 g 6   levocetirizine (XYZAL) 5 MG tablet Take 5 mg by mouth every evening.     MIEBO 1.338 GM/ML SOLN Apply 1 drop to eye 2 (two) times daily.     OVER THE COUNTER MEDICATION Place  1 drop into the right eye 4 (four) times daily. Losartan potassium (PF) (0.5 ml) (0.8 mg/ml) oph soln     Pt spouse states this eye drop was compounded for pt.     Potassium 99 MG TABS Take 1 tablet by mouth daily.     predniSONE (STERAPRED UNI-PAK 21 TAB) 10 MG (21) TBPK tablet Use as directed 21 tablet 0   rosuvastatin (CRESTOR) 20 MG tablet Take 1 tablet (20 mg total) by mouth daily. 30 tablet 2   triamcinolone ointment (KENALOG) 0.5 % Apply 1 Application topically 2 (two) times daily. 30 g 0   TYRVAYA 0.03 MG/ACT SOLN Place 1 spray into both nostrils 2 (two) times daily.     vitamin B-12 (CYANOCOBALAMIN) 1000 MCG tablet Take 1,000 mcg by mouth daily.     vitamin E 400 UNIT capsule Take 400 Units by mouth daily.     Zinc Sulfate (ZINC 15 PO) Take by mouth.     No current facility-administered medications on file prior to visit.    Allergies:   Allergies  Allergen Reactions   Atorvastatin Other (See Comments)    Muscle aches   Carbamazepine Nausea And Vomiting    Chest tightness Chest tightness    Pregabalin Nausea And Vomiting   Augmentin [Amoxicillin-Pot Clavulanate] Diarrhea   Latex Dermatitis      OBJECTIVE:  Physical Exam  There were no vitals filed for this visit. There is no height or weight on file to calculate BMI. No results found.     01/08/2023    2:04 PM  Depression screen PHQ 2/9  Decreased Interest 0  Down, Depressed, Hopeless 0  PHQ - 2 Score 0  Altered sleeping 0  Tired, decreased energy 0  Change in appetite 0  Feeling bad or failure about yourself  0  Trouble concentrating 0   Moving slowly or fidgety/restless 0  Suicidal thoughts 0  PHQ-9 Score 0  Difficult doing work/chores Not difficult at all     General: well developed, well nourished, seated, in no evident distress Head: head normocephalic and atraumatic.   Neck: supple with no carotid or supraclavicular bruits Cardiovascular: regular rate and rhythm, no murmurs Musculoskeletal: no deformity Skin:  no rash/petichiae Vascular:  Normal pulses all extremities   Neurologic Exam Mental Status: Awake and fully alert. Oriented to place and time. Recent and remote memory intact. Attention span, concentration and fund of knowledge appropriate. Mood and affect appropriate.  Cranial Nerves: Fundoscopic exam reveals sharp disc margins. Pupils equal, briskly reactive to light. Extraocular movements full without nystagmus. Visual fields full to confrontation. Hearing intact. Facial sensation intact. Face, tongue, palate moves normally and symmetrically.  Motor: Normal bulk and tone. Normal strength in all tested extremity muscles Sensory.: intact to touch , pinprick , position and vibratory sensation.  Coordination: Rapid alternating movements normal in all extremities. Finger-to-nose and heel-to-shin performed accurately bilaterally. Gait and Station: Arises from chair without difficulty. Stance is normal. Gait demonstrates normal stride length and balance with ***. Tandem walk and heel toe ***.  Reflexes: 1+ and symmetric. Toes downgoing.     NIHSS  *** Modified Rankin  ***      ASSESSMENT: SULEYMAN EHRMAN is a 67 y.o. year old male with incidental finding of right caudate head infarct on 01/04/2023 likely secondary to small vessel disease postradiation therapy after presenting with transient inability to sleep due to coughing spell. Vascular risk factors include HTN, HLD, advanced age, former tobacco use and hx of right  side meningioma s/p surgical resection and radiation therapy.      PLAN:  Ischemic  stroke:  Residual deficit: ***.  Continue aspirin 81mg  daily and rosuvastatin (Crestor) for secondary stroke prevention.   Discussed secondary stroke prevention measures and importance of close PCP follow up for aggressive stroke risk factor management including BP goal<130/90, HLD with LDL goal<70 and DM with A1c.<7 .  Stroke labs 12/2022: LDL 124, A1c 5.9 I have gone over the pathophysiology of stroke, warning signs and symptoms, risk factors and their management in some detail with instructions to go to the closest emergency room for symptoms of concern.     Follow up in *** or call earlier if needed   CC:  [facility] provider: Dr. Pearlean Brownie PCP: [name], FNP    I spent *** minutes of face-to-face and non-face-to-face time with patient.  This included previsit chart review including review of recent hospitalization, lab review, study review, order entry, electronic health record documentation, patient education regarding recent stroke including etiology, secondary stroke prevention measures and importance of managing stroke risk factors, residual deficits and typical recovery time and answered all other questions to patient satisfaction   Frank Hayden, AGNP-BC  [facility] [address] [place], [place] [address]  Phone [phone] Fax [phone] Note: This document was prepared with digital dictation and possible smart phrase technology. Any transcriptional errors that result from this process are unintentional.

## 2023-03-20 ENCOUNTER — Encounter: Payer: Self-pay | Admitting: Adult Health

## 2023-03-20 ENCOUNTER — Ambulatory Visit (INDEPENDENT_AMBULATORY_CARE_PROVIDER_SITE_OTHER): Payer: BC Managed Care – PPO | Admitting: Adult Health

## 2023-03-20 VITALS — BP 124/78 | HR 76 | Ht 72.0 in | Wt 179.0 lb

## 2023-03-20 DIAGNOSIS — D329 Benign neoplasm of meninges, unspecified: Secondary | ICD-10-CM

## 2023-03-20 DIAGNOSIS — I639 Cerebral infarction, unspecified: Secondary | ICD-10-CM | POA: Diagnosis not present

## 2023-03-20 MED ORDER — ROSUVASTATIN CALCIUM 40 MG PO TABS: 40.0000 mg | ORAL_TABLET | Freq: Every day | ORAL | 3 refills | Status: DC

## 2023-03-20 NOTE — Patient Instructions (Addendum)
Continue aspirin 81 mg daily  and increase Crestor to 40mg  daily for secondary stroke prevention  Request primary doctor recheck cholesterol levels in 4-6 months  Continue to follow up with PCP regarding blood pressure and cholesterol management  Maintain strict control of hypertension with blood pressure goal below 130/90 and cholesterol with LDL cholesterol (bad cholesterol) goal below 70 mg/dL.   Signs of a Stroke? Follow the BEFAST method:  Balance Watch for a sudden loss of balance, trouble with coordination or vertigo Eyes Is there a sudden loss of vision in one or both eyes? Or double vision?  Face: Ask the person to smile. Does one side of the face droop or is it numb?  Arms: Ask the person to raise both arms. Does one arm drift downward? Is there weakness or numbness of a leg? Speech: Ask the person to repeat a simple phrase. Does the speech sound slurred/strange? Is the person confused ? Time: If you observe any of these signs, call 911.       Thank you for coming to see [place] at [facility]. I hope we have been able to provide you high quality care today.  You may receive a patient satisfaction survey over the next few weeks. We would appreciate your feedback and comments so that we may continue to improve ourselves and the health of our patients.    Stroke Prevention Some medical conditions and lifestyle choices can lead to a higher risk for a stroke. You can help to prevent a stroke by eating healthy foods and exercising. It also helps to not smoke and to manage any health problems you may have. How can this condition affect me? A stroke is an emergency. It should be treated right away. A stroke can lead to brain damage or threaten your life. There is a better chance of surviving and getting better after a stroke if you get medical help right away. What can increase my risk? The following medical conditions may increase your risk of a stroke: Diseases of  the heart and blood vessels (cardiovascular disease). High blood pressure (hypertension). Diabetes. High cholesterol. Sickle cell disease. Problems with blood clotting. Being very overweight. Sleeping problems (obstructivesleep apnea). Other risk factors include: Being older than age 26. A history of blood clots, stroke, or mini-stroke (TIA). Race, ethnic background, or a family history of stroke. Smoking or using tobacco products. Taking birth control pills, especially if you smoke. Heavy alcohol and drug use. Not being active. What actions can I take to prevent this? Manage your health conditions High cholesterol. Eat a healthy diet. If this is not enough to manage your cholesterol, you may need to take medicines. Take medicines as told by your doctor. High blood pressure. Try to keep your blood pressure below 130/80. If your blood pressure cannot be managed through a healthy diet and regular exercise, you may need to take medicines. Take medicines as told by your doctor. Ask your doctor if you should check your blood pressure at home. Have your blood pressure checked every year. Diabetes. Eat a healthy diet and get regular exercise. If your blood sugar (glucose) cannot be managed through diet and exercise, you may need to take medicines. Take medicines as told by your doctor. Talk to your doctor about getting checked for sleeping problems. Signs of a problem can include: Snoring a lot. Feeling very tired. Make sure that you manage any other conditions you have. Nutrition  Follow instructions from your doctor about what to eat  or drink. You may be told to: Eat and drink fewer calories each day. Limit how much salt (sodium) you use to 1,500 milligrams (mg) each day. Use only healthy fats for cooking, such as olive oil, canola oil, and sunflower oil. Eat healthy foods. To do this: Choose foods that are high in fiber. These include whole grains, and fresh fruits and  vegetables. Eat at least 5 servings of fruits and vegetables a day. Try to fill one-half of your plate with fruits and vegetables at each meal. Choose low-fat (lean) proteins. These include low-fat cuts of meat, chicken without skin, fish, tofu, beans, and nuts. Eat low-fat dairy products. Avoid foods that: Are high in salt. Have saturated fat. Have trans fat. Have cholesterol. Are processed or pre-made. Count how many carbohydrates you eat and drink each day. Lifestyle If you drink alcohol: Limit how much you have to: 0-1 drink a day for women who are not pregnant. 0-2 drinks a day for men. Know how much alcohol is in your drink. In the [place] one drink equals one 12 oz bottle of beer ( ), one 5 oz glass of wine ( ), or one 1 oz glass of hard liquor (44mL). Do not smoke or use any products that have nicotine or tobacco. If you need help quitting, ask your doctor. Avoid secondhand smoke. Do not use drugs. Activity  Try to stay at a healthy weight. Get at least 30 minutes of exercise on most days, such as: Fast walking. Biking. Swimming. Medicines Take over-the-counter and prescription medicines only as told by your doctor. Avoid taking birth control pills. Talk to your doctor about the risks of taking birth control pills if: You are over 66 years old. You smoke. You get very bad headaches. You have had a blood clot. Where to find more information American Stroke Association: www.strokeassociation.org Get help right away if: You or a loved one has any signs of a stroke. "BE FAST" is an easy way to remember the warning signs: B - Balance. Dizziness, sudden trouble walking, or loss of balance. E - Eyes. Trouble seeing or a change in how you see. F - Face. Sudden weakness or loss of feeling of the face. The face or eyelid may droop on one side. A - Arms. Weakness or loss of feeling in an arm. This happens all of a sudden and most often on one side of the body. S -  Speech. Sudden trouble speaking, slurred speech, or trouble understanding what people say. T - Time. Time to call emergency services. Write down what time symptoms started. You or a loved one has other signs of a stroke, such as: A sudden, very bad headache with no known cause. Feeling like you may vomit (nausea). Vomiting. A seizure. These symptoms may be an emergency. Get help right away. Call your local emergency services (911 in the [place] Do not wait to see if the symptoms will go away. Do not drive yourself to the hospital. Summary You can help to prevent a stroke by eating healthy, exercising, and not smoking. It also helps to manage any health problems you have. Do not smoke or use any products that contain nicotine or tobacco. Get help right away if you or a loved one has any signs of a stroke. This information is not intended to replace advice given to you by your health care provider. Make sure you discuss any questions you have with your health care provider. Document Revised: 07/16/2022 Document Reviewed: 07/16/2022 Elsevier Patient Education  2024 [employer]

## 2023-05-03 ENCOUNTER — Ambulatory Visit (INDEPENDENT_AMBULATORY_CARE_PROVIDER_SITE_OTHER): Payer: BC Managed Care – PPO | Admitting: Family Medicine

## 2023-05-03 ENCOUNTER — Encounter: Payer: Self-pay | Admitting: Family Medicine

## 2023-05-03 VITALS — BP 120/80 | HR 68 | Temp 96.3°F | Ht 73.0 in | Wt 175.6 lb

## 2023-05-03 DIAGNOSIS — W57XXXA Bitten or stung by nonvenomous insect and other nonvenomous arthropods, initial encounter: Secondary | ICD-10-CM

## 2023-05-03 DIAGNOSIS — R21 Rash and other nonspecific skin eruption: Secondary | ICD-10-CM | POA: Diagnosis not present

## 2023-05-03 MED ORDER — HYDROXYZINE PAMOATE 25 MG PO CAPS
25.0000 mg | ORAL_CAPSULE | Freq: Three times a day (TID) | ORAL | 0 refills | Status: DC | PRN
Start: 2023-05-03 — End: 2023-08-22

## 2023-05-03 MED ORDER — TRIAMCINOLONE ACETONIDE 0.5 % EX OINT: 1.0000 | TOPICAL_OINTMENT | Freq: Two times a day (BID) | CUTANEOUS | 0 refills | Status: DC

## 2023-05-03 NOTE — Progress Notes (Signed)
Subjective:  Patient ID: Frank Hayden, male    DOB: [birth year], 67 y.o.   MRN: [id number]  Patient Care Team: [name], FNP as PCP - General (Family Medicine)   Chief Complaint:  Rash (Patient states last night he noticed a rash from his waste down.)   HPI: Frank Hayden is a 67 y.o. male presenting on 05/03/2023 for Rash (Patient states last night he noticed a rash from his waste down.)  Rash  States that from his waist down he is "broke out"  States that he has dogs and they walk in the woods and then come in the house. Reports that he himself has not been walking through woods, has not pulled any ticks.  Started yesterday night  Endorses itching Has not changed lotion, detergent,  Changed to olay body wash, which he has used in the past without issues.  Has not tried anything to help it at home.   Denies fever, fatigue, joint pain, N/V.   Relevant past medical, surgical, family, and social history reviewed and updated as indicated.  Allergies and medications reviewed and updated. Data reviewed: Chart in Epic.   Past Medical History:  Diagnosis Date   Anxiety    Has headaches associated with past surgery.   Cancer (HCC)    Brain    Hearing loss    decreased 95% right / decreased 24% left ear    Past Surgical History:  Procedure Laterality Date   APPENDECTOMY     EYE SURGERY Right    Tumor removed behind eye.   TONSILLECTOMY      Social History   Socioeconomic History   Marital status: Married    Spouse name: Not on file   Number of children: Not on file   Years of education: Not on file   Highest education level: 12th grade  Occupational History   Not on file  Tobacco Use   Smoking status: Former   Smokeless tobacco: Never   Tobacco comments:    quit 40 years ago  Vaping Use   Vaping status: Never Used  Substance and Sexual Activity   Alcohol use: No   Drug use: No   Sexual activity: Not on file  Other Topics Concern   Not on file   Social History Narrative   Not on file   Social Determinants of Health   Financial Resource Strain: Low Risk  (12/10/2022)   Overall Financial Resource Strain (CARDIA)    Difficulty of Paying Living Expenses: Not hard at all  Food Insecurity: No Food Insecurity (01/05/2023)   Hunger Vital Sign    Worried About Running Out of Food in the Last Year: Never true    Ran Out of Food in the Last Year: Never true  Transportation Needs: No Transportation Needs (01/05/2023)   PRAPARE - Administrator, Civil Service (Medical): No    Lack of Transportation (Non-Medical): No  Physical Activity: Sufficiently Active (12/10/2022)   Exercise Vital Sign    Days of Exercise per Week: 5 days    Minutes of Exercise per Session: 60 min  Stress: No Stress Concern Present (12/10/2022)   [name] of Occupational Health - Occupational Stress Questionnaire    Feeling of Stress : Not at all  Social Connections: Moderately Integrated (12/10/2022)   Social Connection and Isolation Panel [NHANES]    Frequency of Communication with Friends and Family: Three times a week    Frequency of Social Gatherings with  Friends and Family: Twice a week    Attends Religious Services: More than 4 times per year    Active Member of [employer] or Organizations: No    Attends Engineer, structural: Not on file    Marital Status: Married  Catering manager Violence: Not At Risk (01/05/2023)   Humiliation, Afraid, Rape, and Kick questionnaire    Fear of Current or Ex-Partner: No    Emotionally Abused: No    Physically Abused: No    Sexually Abused: No    Outpatient Encounter Medications as of 05/03/2023  Medication Sig   acetaminophen (TYLENOL) 500 MG tablet Take 1 tablet (500 mg total) by mouth every 6 (six) hours as needed.   aspirin EC 81 MG tablet Take 1 tablet (81 mg total) by mouth daily. Swallow whole.   Bepotastine Besilate 1.5 % SOLN    Cholecalciferol (VITAMIN D) 2000 units CAPS Take by mouth.    cycloSPORINE (RESTASIS) 0.05 % ophthalmic emulsion 1 drop 2 (two) times daily.   fluticasone (FLONASE) 50 MCG/ACT nasal spray Place 2 sprays into both nostrils daily.   levocetirizine (XYZAL) 5 MG tablet Take 5 mg by mouth every evening.   MIEBO 1.338 GM/ML SOLN Apply 1 drop to eye 2 (two) times daily.   OVER THE COUNTER MEDICATION Place 1 drop into the right eye 4 (four) times daily. Losartan potassium (PF) (0.5 ml) (0.8 mg/ml) oph soln     Pt spouse states this eye drop was compounded for pt.   Potassium 99 MG TABS Take 1 tablet by mouth daily.   rosuvastatin (CRESTOR) 40 MG tablet Take 1 tablet (40 mg total) by mouth daily.   triamcinolone ointment (KENALOG) 0.5 % Apply 1 Application topically 2 (two) times daily.   TYRVAYA 0.03 MG/ACT SOLN Place 1 spray into both nostrils 2 (two) times daily.   vitamin B-12 (CYANOCOBALAMIN) 1000 MCG tablet Take 1,000 mcg by mouth daily.   vitamin E 400 UNIT capsule Take 400 Units by mouth daily.   ALPRAZolam (XANAX) 1 MG tablet Take 1 tablet (1 mg total) by mouth at bedtime as needed for anxiety. (Patient not taking: Reported on 05/03/2023)   Zinc Sulfate (ZINC 15 PO) Take by mouth. (Patient not taking: Reported on 05/03/2023)   [DISCONTINUED] benzonatate (TESSALON) 200 MG capsule Take 1 capsule (200 mg total) by mouth 2 (two) times daily as needed for cough.   No facility-administered encounter medications on file as of 05/03/2023.    Allergies  Allergen Reactions   Atorvastatin Other (See Comments)    Muscle aches   Carbamazepine Nausea And Vomiting    Chest tightness Chest tightness    Pregabalin Nausea And Vomiting   Augmentin [Amoxicillin-Pot Clavulanate] Diarrhea   Latex Dermatitis    Review of Systems As per HPI  Objective:  BP 120/80   Pulse 68   Temp (!) 96.3 F (35.7 C) (Temporal)   Ht 6\' 1"  (1.854 m)   Wt 175 lb 9.6 oz (79.7 kg)   SpO2 98%   BMI 23.17 kg/m    Wt Readings from Last 3 Encounters:  05/03/23 175 lb 9.6 oz (79.7 kg)   03/20/23 179 lb (81.2 kg)  01/08/23 180 lb (81.6 kg)   Physical Exam Constitutional:      General: He is awake. He is not in acute distress.    Appearance: Normal appearance. He is well-developed and well-groomed. He is not ill-appearing, toxic-appearing or diaphoretic.  Cardiovascular:     Rate and Rhythm: Normal rate  and regular rhythm.     Pulses: Normal pulses.          Radial pulses are 2+ on the right side and 2+ on the left side.       Posterior tibial pulses are 2+ on the right side and 2+ on the left side.     Heart sounds: Normal heart sounds. No murmur heard.    No gallop.  Pulmonary:     Effort: Pulmonary effort is normal. No respiratory distress.     Breath sounds: Normal breath sounds. No stridor. No wheezing, rhonchi or rales.  Musculoskeletal:     Cervical back: Full passive range of motion without pain and neck supple.     Right lower leg: No edema.     Left lower leg: No edema.  Skin:    General: Skin is warm.     Capillary Refill: Capillary refill takes less than 2 seconds.     Comments: Multiple diffuse erythematous papules along bilateral thighs, waist/groin. Sites also along right ankle.   Neurological:     General: No focal deficit present.     Mental Status: He is alert, oriented to person, place, and time and easily aroused. Mental status is at baseline.     GCS: GCS eye subscore is 4. GCS verbal subscore is 5. GCS motor subscore is 6.     Motor: No weakness.  Psychiatric:        Attention and Perception: Attention and perception normal.        Mood and Affect: Mood and affect normal.        Speech: Speech normal.        Behavior: Behavior normal. Behavior is cooperative.        Thought Content: Thought content normal. Thought content does not include homicidal or suicidal ideation. Thought content does not include homicidal or suicidal plan.        Cognition and Memory: Cognition and memory normal.        Judgment: Judgment normal.    Results for  orders placed or performed in visit on 02/19/23  Comprehensive metabolic panel  Result Value Ref Range   Glucose 99 70 - 99 mg/dL   BUN 13 8 - 27 mg/dL   Creatinine, Ser 8.75 0.76 - 1.27 mg/dL   eGFR 78 >64 PP/IRJ/1.88   BUN/Creatinine Ratio 12 10 - 24   Sodium 140 134 - 144 mmol/L   Potassium 4.3 3.5 - 5.2 mmol/L   Chloride 104 96 - 106 mmol/L   CO2 23 20 - 29 mmol/L   Calcium 9.6 8.6 - 10.2 mg/dL   Total Protein 6.6 6.0 - 8.5 g/dL   Albumin 4.6 3.9 - 4.9 g/dL   Globulin, Total 2.0 1.5 - 4.5 g/dL   Bilirubin Total 0.7 0.0 - 1.2 mg/dL   Alkaline Phosphatase 70 44 - 121 IU/L   AST 18 0 - 40 IU/L   ALT 19 0 - 44 IU/L  Lipid panel  Result Value Ref Range   Cholesterol, Total 192 100 - 199 mg/dL   Triglycerides 416 0 - 149 mg/dL   HDL 46 >60 mg/dL   VLDL Cholesterol Cal 25 5 - 40 mg/dL   LDL Chol Calc (NIH) 630 (H) 0 - 99 mg/dL   Chol/HDL Ratio 4.2 0.0 - 5.0 ratio       01/08/2023    2:04 PM 12/31/2022   12:18 PM 12/11/2022   12:05 PM 11/08/2022   10:25 AM 10/25/2022  2:14 PM  Depression screen PHQ 2/9  Decreased Interest 0 0 0 0 0  Down, Depressed, Hopeless 0 0 0 0 0  PHQ - 2 Score 0 0 0 0 0  Altered sleeping 0 0  0   Tired, decreased energy 0 0  0   Change in appetite 0 0  0   Feeling bad or failure about yourself  0 0  0   Trouble concentrating 0 0  0   Moving slowly or fidgety/restless 0 0  0   Suicidal thoughts 0 0  0   PHQ-9 Score 0 0  0   Difficult doing work/chores Not difficult at all Not difficult at all Not difficult at all Not difficult at all       01/08/2023    2:04 PM 12/31/2022   12:18 PM 11/08/2022   10:25 AM 09/11/2022   11:57 AM  GAD 7 : Generalized Anxiety Score  Nervous, Anxious, on Edge 0 0 0 0  Control/stop worrying 0 0 0 0  Worry too much - different things 0 0 0 0  Trouble relaxing 0 0 0 0  Restless 0 0 0 0  Easily annoyed or irritable 0 0 0 0  Afraid - awful might happen 0 0 0 0  Total GAD 7 Score 0 0 0 0  Anxiety Difficulty Not difficult  at all Not difficult at all Not difficult at all Not difficult at all   Pertinent labs & imaging results that were available during my care of the patient were reviewed by me and considered in my medical decision making.  Assessment & Plan:  [place] was seen today for rash.  Diagnoses and all orders for this visit:  Insect bite, unspecified site, initial encounter Medication as below.  Discussed side effects and care of sites at home.  -     triamcinolone ointment (KENALOG) 0.5 %; Apply 1 Application topically 2 (two) times daily. -     hydrOXYzine (VISTARIL) 25 MG capsule; Take 1 capsule (25 mg total) by mouth every 8 (eight) hours as needed.  Continue all other maintenance medications.  Follow up plan: Return if symptoms worsen or fail to improve.  Continue healthy lifestyle choices, including diet (rich in fruits, vegetables, and lean proteins, and low in salt and simple carbohydrates) and exercise (at least 30 minutes of moderate physical activity daily).  Written and verbal instructions provided   The above assessment and management plan was discussed with the patient. The patient verbalized understanding of and has agreed to the management plan. Patient is aware to call the clinic if they develop any new symptoms or if symptoms persist or worsen. Patient is aware when to return to the clinic for a follow-up visit. Patient educated on when it is appropriate to go to the emergency department.   Neale Burly, DNP-FNP [facility] [address] [place], [place] [address] [phone]

## 2023-05-15 DIAGNOSIS — D485 Neoplasm of uncertain behavior of skin: Secondary | ICD-10-CM | POA: Diagnosis not present

## 2023-05-15 DIAGNOSIS — L821 Other seborrheic keratosis: Secondary | ICD-10-CM | POA: Diagnosis not present

## 2023-05-15 DIAGNOSIS — D225 Melanocytic nevi of trunk: Secondary | ICD-10-CM | POA: Diagnosis not present

## 2023-05-15 DIAGNOSIS — L578 Other skin changes due to chronic exposure to nonionizing radiation: Secondary | ICD-10-CM | POA: Diagnosis not present

## 2023-05-15 DIAGNOSIS — L570 Actinic keratosis: Secondary | ICD-10-CM | POA: Diagnosis not present

## 2023-05-15 DIAGNOSIS — L814 Other melanin hyperpigmentation: Secondary | ICD-10-CM | POA: Diagnosis not present

## 2023-05-15 DIAGNOSIS — C4442 Squamous cell carcinoma of skin of scalp and neck: Secondary | ICD-10-CM | POA: Diagnosis not present

## 2023-06-11 DIAGNOSIS — C4442 Squamous cell carcinoma of skin of scalp and neck: Secondary | ICD-10-CM | POA: Diagnosis not present

## 2023-06-18 ENCOUNTER — Telehealth: Payer: Self-pay | Admitting: Adult Health

## 2023-06-18 NOTE — Telephone Encounter (Signed)
Pt's wife called stating that when the pt was here at his last appt it was mentioned that he would need to get blood work done. She would like to know if provider can put in the orders and she would like for them to be released to the [facility] in the [facility]. Please advise.

## 2023-06-18 NOTE — Telephone Encounter (Signed)
Wife is on pt's most recent DPR dated 11-08-22 for all of [facility], she was called back and the message from RN was relayed, this is FYI no call back requested by her.

## 2023-07-23 ENCOUNTER — Ambulatory Visit: Payer: Medicare Other | Admitting: Family

## 2023-07-29 ENCOUNTER — Ambulatory Visit: Payer: Medicare Other

## 2023-07-30 DIAGNOSIS — H168 Other keratitis: Secondary | ICD-10-CM | POA: Diagnosis not present

## 2023-07-30 DIAGNOSIS — H16231 Neurotrophic keratoconjunctivitis, right eye: Secondary | ICD-10-CM | POA: Diagnosis not present

## 2023-07-30 DIAGNOSIS — Z961 Presence of intraocular lens: Secondary | ICD-10-CM | POA: Diagnosis not present

## 2023-07-30 DIAGNOSIS — H04129 Dry eye syndrome of unspecified lacrimal gland: Secondary | ICD-10-CM | POA: Diagnosis not present

## 2023-08-22 ENCOUNTER — Telehealth (INDEPENDENT_AMBULATORY_CARE_PROVIDER_SITE_OTHER): Payer: BC Managed Care – PPO | Admitting: Family

## 2023-08-22 ENCOUNTER — Encounter: Payer: Self-pay | Admitting: Family

## 2023-08-22 VITALS — BP 128/72

## 2023-08-22 DIAGNOSIS — F411 Generalized anxiety disorder: Secondary | ICD-10-CM | POA: Diagnosis not present

## 2023-08-22 DIAGNOSIS — D329 Benign neoplasm of meninges, unspecified: Secondary | ICD-10-CM | POA: Diagnosis not present

## 2023-08-22 DIAGNOSIS — Z8673 Personal history of transient ischemic attack (TIA), and cerebral infarction without residual deficits: Secondary | ICD-10-CM | POA: Insufficient documentation

## 2023-08-22 DIAGNOSIS — Z1211 Encounter for screening for malignant neoplasm of colon: Secondary | ICD-10-CM

## 2023-08-22 DIAGNOSIS — J329 Chronic sinusitis, unspecified: Secondary | ICD-10-CM | POA: Diagnosis not present

## 2023-08-22 DIAGNOSIS — E7849 Other hyperlipidemia: Secondary | ICD-10-CM | POA: Diagnosis not present

## 2023-08-22 MED ORDER — ALBUTEROL SULFATE HFA 108 (90 BASE) MCG/ACT IN AERS: 2.0000 | INHALATION_SPRAY | Freq: Four times a day (QID) | RESPIRATORY_TRACT | 0 refills | Status: DC | PRN

## 2023-08-22 MED ORDER — ROSUVASTATIN CALCIUM 40 MG PO TABS
40.0000 mg | ORAL_TABLET | Freq: Every day | ORAL | 3 refills | Status: AC
Start: 2023-08-22 — End: ?

## 2023-08-22 NOTE — Progress Notes (Signed)
Virtual Visit Consent   Frank Hayden, you are scheduled for a virtual visit with a [facility] provider today. Just as with appointments in the office, your consent must be obtained to participate. Your consent will be active for this visit and any virtual visit you may have with one of our providers in the next 365 days. If you have a MyChart account, a copy of this consent can be sent to you electronically.  As this is a virtual visit, video technology does not allow for your provider to perform a traditional examination. This may limit your provider's ability to fully assess your condition. If your provider identifies any concerns that need to be evaluated in person or the need to arrange testing (such as labs, EKG, etc.), we will make arrangements to do so. Although advances in technology are sophisticated, we cannot ensure that it will always work on either your end or our end. If the connection with a video visit is poor, the visit may have to be switched to a telephone visit. With either a video or telephone visit, we are not always able to ensure that we have a secure connection.  By engaging in this virtual visit, you consent to the provision of healthcare and authorize for your insurance to be billed (if applicable) for the services provided during this visit. Depending on your insurance coverage, you may receive a charge related to this service.  I need to obtain your verbal consent now. Are you willing to proceed with your visit today? Frank Hayden has provided verbal consent on 08/22/2023 for a virtual visit (video or telephone). Frank Rodney, FNP  Date: 08/22/2023 1:47 PM  Virtual Visit via Video Note   I, Frank Hayden, connected with  Frank Hayden  ([id number], 14-Aug-1956) on 08/22/23 at  2:25 PM EST by a video-enabled telemedicine application and verified that I am speaking with the correct person using two identifiers.  Location: Patient: Virtual Visit Location Patient:  Home Provider: Virtual Visit Location Provider: Home Office   I discussed the limitations of evaluation and management by telemedicine and the availability of in person appointments. The patient expressed understanding and agreed to proceed.    History of Present Illness: DEMTRIUS Hayden is a 67 y.o. who identifies as a male who was assigned male at birth, and is being seen today for  chronic follow up. He had a meningioma partially removed in 02/1999 that involved is right sinus cavity, optic nerve, and carotid artery. He completed radiation. He is followed by a Neurosurgeon annually to continue to monitor and has another meningioma.  He takes 1 mg Xanax to help with nerve pain on side of face.    He is followed by Dermatologists every 6 months. He is followed by Ophthalmologist every 4 months.   He is followed by ENT for chronic sinusitis and attic perforation of TM.   He had a TIA on 01/04/23. He saw a neurologists who said to follow up as needed.   HPI: Anxiety Presents for follow-up visit. Symptoms include excessive worry. Patient reports no nervous/anxious behavior or restlessness. Symptoms occur rarely. The severity of symptoms is mild.    Hyperlipidemia This is a chronic problem. The current episode started more than 1 year ago. Current antihyperlipidemic treatment includes statins. The current treatment provides moderate improvement of lipids. Risk factors for coronary artery disease include dyslipidemia and a sedentary lifestyle.    Problems:  Patient Active Problem List   Diagnosis Date Noted  Hx-TIA (transient ischemic attack) 08/22/2023   TIA (transient ischemic attack) 01/04/2023   GAD (generalized anxiety disorder) 09/11/2022   Myalgia due to statin 06/14/2022   Sensorineural hearing loss (SNHL), bilateral 10/03/2018   Spinal stenosis in cervical region 09/19/2018   Chronic sinusitis 06/20/2018   Meningioma (HCC) 08/12/2014   Meningioma of right sphenoid wing involving  cavernous sinus (HCC) 08/12/2014    Allergies:  Allergies  Allergen Reactions   Atorvastatin Other (See Comments)    Muscle aches   Carbamazepine Nausea And Vomiting    Chest tightness Chest tightness    Pregabalin Nausea And Vomiting   Augmentin [Amoxicillin-Pot Clavulanate] Diarrhea   Latex Dermatitis   Medications:  Current Outpatient Medications:    albuterol (VENTOLIN HFA) 108 (90 Base) MCG/ACT inhaler, Inhale 2 puffs into the lungs every 6 (six) hours as needed for wheezing or shortness of breath., Disp: 8 g, Rfl: 0   acetaminophen (TYLENOL) 500 MG tablet, Take 1 tablet (500 mg total) by mouth every 6 (six) hours as needed., Disp: 30 tablet, Rfl: 0   aspirin EC 81 MG tablet, Take 1 tablet (81 mg total) by mouth daily. Swallow whole., Disp: 30 tablet, Rfl: 12   Bepotastine Besilate 1.5 % SOLN, , Disp: , Rfl:    Cholecalciferol (VITAMIN D) 2000 units CAPS, Take by mouth., Disp: , Rfl:    cycloSPORINE (RESTASIS) 0.05 % ophthalmic emulsion, 1 drop 2 (two) times daily., Disp: , Rfl:    fluticasone (FLONASE) 50 MCG/ACT nasal spray, Place 2 sprays into both nostrils daily., Disp: 16 g, Rfl: 6   levocetirizine (XYZAL) 5 MG tablet, Take 5 mg by mouth every evening., Disp: , Rfl:    MIEBO 1.338 GM/ML SOLN, Apply 1 drop to eye 2 (two) times daily., Disp: , Rfl:    OVER THE COUNTER MEDICATION, Place 1 drop into the right eye 4 (four) times daily. Losartan potassium (PF) (0.5 ml) (0.8 mg/ml) oph soln     Pt spouse states this eye drop was compounded for pt., Disp: , Rfl:    Potassium 99 MG TABS, Take 1 tablet by mouth daily., Disp: , Rfl:    rosuvastatin (CRESTOR) 40 MG tablet, Take 1 tablet (40 mg total) by mouth daily., Disp: 90 tablet, Rfl: 3   triamcinolone ointment (KENALOG) 0.5 %, Apply 1 Application topically 2 (two) times daily., Disp: 30 g, Rfl: 0   TYRVAYA 0.03 MG/ACT SOLN, Place 1 spray into both nostrils 2 (two) times daily., Disp: , Rfl:    vitamin B-12 (CYANOCOBALAMIN) 1000 MCG  tablet, Take 1,000 mcg by mouth daily., Disp: , Rfl:    vitamin E 400 UNIT capsule, Take 400 Units by mouth daily., Disp: , Rfl:   Observations/Objective: Patient is well-developed, well-nourished in no acute distress.  Resting comfortably  at home.  Head is normocephalic, atraumatic.  No labored breathing.  Speech is clear and coherent with logical content.  Patient is alert and oriented at baseline.    Assessment and Plan: 1. Meningioma (HCC) (Primary) - CMP14+EGFR; Future - CBC with Differential/Platelet; Future  2. Chronic sinusitis, unspecified location - CMP14+EGFR; Future - CBC with Differential/Platelet; Future  3. GAD (generalized anxiety disorder) - CMP14+EGFR; Future - CBC with Differential/Platelet; Future  4. Other hyperlipidemia - CMP14+EGFR; Future - CBC with Differential/Platelet; Future - Lipid panel; Future - rosuvastatin (CRESTOR) 40 MG tablet; Take 1 tablet (40 mg total) by mouth daily.  Dispense: 90 tablet; Refill: 3  5. Colon cancer screening - Cologuard - CMP14+EGFR; Future -  CBC with Differential/Platelet; Future  6. Hx-TIA (transient ischemic attack)  Labs pending, pt will come to office and get drawn in next few days Low fat diet and exercise  Continue statin  Follow up in 6 months   Follow Up Instructions: I discussed the assessment and treatment plan with the patient. The patient was provided an opportunity to ask questions and all were answered. The patient agreed with the plan and demonstrated an understanding of the instructions.  A copy of instructions were sent to the patient via MyChart unless otherwise noted below.     The patient was advised to call back or seek an in-person evaluation if the symptoms worsen or if the condition fails to improve as anticipated.    Frank Rodney, FNP

## 2023-08-26 ENCOUNTER — Other Ambulatory Visit: Payer: Medicare Other

## 2023-08-26 DIAGNOSIS — D329 Benign neoplasm of meninges, unspecified: Secondary | ICD-10-CM | POA: Diagnosis not present

## 2023-08-26 DIAGNOSIS — J329 Chronic sinusitis, unspecified: Secondary | ICD-10-CM | POA: Diagnosis not present

## 2023-08-26 DIAGNOSIS — Z1211 Encounter for screening for malignant neoplasm of colon: Secondary | ICD-10-CM

## 2023-08-26 DIAGNOSIS — E7849 Other hyperlipidemia: Secondary | ICD-10-CM

## 2023-08-26 DIAGNOSIS — F411 Generalized anxiety disorder: Secondary | ICD-10-CM

## 2023-08-27 LAB — CMP14+EGFR
ALT: 27 [IU]/L (ref 0–44)
AST: 22 [IU]/L (ref 0–40)
Albumin: 4.8 g/dL (ref 3.9–4.9)
Alkaline Phosphatase: 71 [IU]/L (ref 44–121)
BUN/Creatinine Ratio: 14 (ref 10–24)
BUN: 12 mg/dL (ref 8–27)
Bilirubin Total: 0.5 mg/dL (ref 0.0–1.2)
CO2: 23 mmol/L (ref 20–29)
Calcium: 9.7 mg/dL (ref 8.6–10.2)
Chloride: 102 mmol/L (ref 96–106)
Creatinine, Ser: 0.84 mg/dL (ref 0.76–1.27)
Globulin, Total: 1.8 g/dL (ref 1.5–4.5)
Glucose: 97 mg/dL (ref 70–99)
Potassium: 4.8 mmol/L (ref 3.5–5.2)
Sodium: 140 mmol/L (ref 134–144)
Total Protein: 6.6 g/dL (ref 6.0–8.5)
eGFR: 95 mL/min/{1.73_m2} (ref >59)

## 2023-08-27 LAB — CBC WITH DIFFERENTIAL/PLATELET
Basophils Absolute: 0 10*3/uL (ref 0.0–0.2)
Basos: 1 %
EOS (ABSOLUTE): 0.2 10*3/uL (ref 0.0–0.4)
Eos: 3 %
Hematocrit: 44.5 % (ref 37.5–51.0)
Hemoglobin: 14.7 g/dL (ref 13.0–17.7)
Immature Grans (Abs): 0 10*3/uL (ref 0.0–0.1)
Immature Granulocytes: 0 %
Lymphocytes Absolute: 1 10*3/uL (ref 0.7–3.1)
Lymphs: 17 %
MCH: 30 pg (ref 26.6–33.0)
MCHC: 33 g/dL (ref 31.5–35.7)
MCV: 91 fL (ref 79–97)
Monocytes Absolute: 0.4 10*3/uL (ref 0.1–0.9)
Monocytes: 7 %
Neutrophils Absolute: 4.3 10*3/uL (ref 1.4–7.0)
Neutrophils: 72 %
Platelets: 182 10*3/uL (ref 150–450)
RBC: 4.9 x10E6/uL (ref 4.14–5.80)
RDW: 13.9 % (ref 11.6–15.4)
WBC: 6 10*3/uL (ref 3.4–10.8)

## 2023-08-27 LAB — LIPID PANEL
Chol/HDL Ratio: 4 {ratio} (ref 0.0–5.0)
Cholesterol, Total: 190 mg/dL (ref 100–199)
HDL: 47 mg/dL (ref >39)
LDL Chol Calc (NIH): 106 mg/dL — ABNORMAL HIGH (ref 0–99)
Triglycerides: 212 mg/dL — ABNORMAL HIGH (ref 0–149)
VLDL Cholesterol Cal: 37 mg/dL (ref 5–40)

## 2023-09-10 DIAGNOSIS — Z1211 Encounter for screening for malignant neoplasm of colon: Secondary | ICD-10-CM | POA: Diagnosis not present

## 2023-09-11 DIAGNOSIS — L570 Actinic keratosis: Secondary | ICD-10-CM | POA: Diagnosis not present

## 2023-09-11 DIAGNOSIS — Z08 Encounter for follow-up examination after completed treatment for malignant neoplasm: Secondary | ICD-10-CM | POA: Diagnosis not present

## 2023-09-11 DIAGNOSIS — L538 Other specified erythematous conditions: Secondary | ICD-10-CM | POA: Diagnosis not present

## 2023-09-11 DIAGNOSIS — L821 Other seborrheic keratosis: Secondary | ICD-10-CM | POA: Diagnosis not present

## 2023-09-11 DIAGNOSIS — L111 Transient acantholytic dermatosis [Grover]: Secondary | ICD-10-CM | POA: Diagnosis not present

## 2023-09-11 DIAGNOSIS — Z85828 Personal history of other malignant neoplasm of skin: Secondary | ICD-10-CM | POA: Diagnosis not present

## 2023-09-11 DIAGNOSIS — L820 Inflamed seborrheic keratosis: Secondary | ICD-10-CM | POA: Diagnosis not present

## 2023-09-17 ENCOUNTER — Ambulatory Visit (INDEPENDENT_AMBULATORY_CARE_PROVIDER_SITE_OTHER): Payer: BC Managed Care – PPO | Admitting: Family

## 2023-09-17 ENCOUNTER — Encounter: Payer: Self-pay | Admitting: Family

## 2023-09-17 VITALS — BP 123/66 | HR 79 | Temp 97.8°F | Ht 73.0 in | Wt 181.4 lb

## 2023-09-17 DIAGNOSIS — Z0000 Encounter for general adult medical examination without abnormal findings: Secondary | ICD-10-CM

## 2023-09-17 DIAGNOSIS — Z0001 Encounter for general adult medical examination with abnormal findings: Secondary | ICD-10-CM | POA: Diagnosis not present

## 2023-09-17 DIAGNOSIS — Z8673 Personal history of transient ischemic attack (TIA), and cerebral infarction without residual deficits: Secondary | ICD-10-CM

## 2023-09-17 DIAGNOSIS — D329 Benign neoplasm of meninges, unspecified: Secondary | ICD-10-CM | POA: Diagnosis not present

## 2023-09-17 DIAGNOSIS — F411 Generalized anxiety disorder: Secondary | ICD-10-CM | POA: Diagnosis not present

## 2023-09-17 DIAGNOSIS — J329 Chronic sinusitis, unspecified: Secondary | ICD-10-CM | POA: Diagnosis not present

## 2023-09-17 DIAGNOSIS — E782 Mixed hyperlipidemia: Secondary | ICD-10-CM

## 2023-09-17 MED ORDER — ALBUTEROL SULFATE HFA 108 (90 BASE) MCG/ACT IN AERS: 2.0000 | INHALATION_SPRAY | Freq: Four times a day (QID) | RESPIRATORY_TRACT | 4 refills | Status: AC | PRN

## 2023-09-17 MED ORDER — DOXYCYCLINE HYCLATE 100 MG PO TABS
100.0000 mg | ORAL_TABLET | Freq: Two times a day (BID) | ORAL | 0 refills | Status: DC
Start: 2023-09-17 — End: 2024-01-08

## 2023-09-17 NOTE — Patient Instructions (Signed)
Stroke Prevention Some medical conditions and behaviors can lead to a higher chance of having a stroke. You can help prevent a stroke by eating healthy, exercising, not smoking, and managing any medical conditions you have. Stroke is a leading cause of functional impairment. Primary prevention is particularly important because a majority of strokes are first-time events. Stroke changes the lives of not only those who experience a stroke but also their family and other caregivers. How can this condition affect me? A stroke is a medical emergency and should be treated right away. A stroke can lead to brain damage and can sometimes be life-threatening. If a person gets medical treatment right away, there is a better chance of surviving and recovering from a stroke. What can increase my risk? The following medical conditions may increase your risk of a stroke: Cardiovascular disease. High blood pressure (hypertension). Diabetes. High cholesterol. Sickle cell disease. Blood clotting disorders (hypercoagulable state). Obesity. Sleep disorders (obstructive sleep apnea). Other risk factors include: Being older than age 60. Having a history of blood clots, stroke, or mini-stroke (transient ischemic attack, TIA). Genetic factors, such as race, ethnicity, or a family history of stroke. Smoking cigarettes or using other tobacco products. Taking birth control pills, especially if you also use tobacco. Heavy use of alcohol or drugs, especially cocaine and methamphetamine. Physical inactivity. What actions can I take to prevent this? Manage your health conditions High cholesterol levels. Eating a healthy diet is important for preventing high cholesterol. If cholesterol cannot be managed through diet alone, you may need to take medicines. Take any prescribed medicines to control your cholesterol as told by your health care provider. Hypertension. To reduce your risk of stroke, try to keep your blood  pressure below 130/80. Eating a healthy diet and exercising regularly are important for controlling blood pressure. If these steps are not enough to manage your blood pressure, you may need to take medicines. Take any prescribed medicines to control hypertension as told by your health care provider. Ask your health care provider if you should monitor your blood pressure at home. Have your blood pressure checked every year, even if your blood pressure is normal. Blood pressure increases with age and some medical conditions. Diabetes. Eating a healthy diet and exercising regularly are important parts of managing your blood sugar (glucose). If your blood sugar cannot be managed through diet and exercise, you may need to take medicines. Take any prescribed medicines to control your diabetes as told by your health care provider. Get evaluated for obstructive sleep apnea. Talk to your health care provider about getting a sleep evaluation if you snore a lot or have excessive sleepiness. Make sure that any other medical conditions you have, such as atrial fibrillation or atherosclerosis, are managed. Nutrition Follow instructions from your health care provider about what to eat or drink to help manage your health condition. These instructions may include: Reducing your daily calorie intake. Limiting how much salt (sodium) you use to 1,500 milligrams (mg) each day. Using only healthy fats for cooking, such as olive oil, canola oil, or sunflower oil. Eating healthy foods. You can do this by: Choosing foods that are high in fiber, such as whole grains, and fresh fruits and vegetables. Eating at least 5 servings of fruits and vegetables a day. Try to fill one-half of your plate with fruits and vegetables at each meal. Choosing lean protein foods, such as lean cuts of meat, poultry without skin, fish, tofu, beans, and nuts. Eating low-fat dairy products. Avoiding   foods that are high in sodium. This can help  lower blood pressure. Avoiding foods that have saturated fat, trans fat, and cholesterol. This can help prevent high cholesterol. Avoiding processed and prepared foods. Counting your daily carbohydrate intake.  Lifestyle If you drink alcohol: Limit how much you have to: 0-1 drink a day for women who are not pregnant. 0-2 drinks a day for men. Know how much alcohol is in your drink. In the [place] one drink equals one 12 oz bottle of beer (355mL), one 5 oz glass of wine (148mL), or one 1 oz glass of hard liquor (44mL). Do not use any products that contain nicotine or tobacco. These products include cigarettes, chewing tobacco, and vaping devices, such as e-cigarettes. If you need help quitting, ask your health care provider. Avoid secondhand smoke. Do not use drugs. Activity  Try to stay at a healthy weight. Get at least 30 minutes of exercise on most days, such as: Fast walking. Biking. Swimming. Medicines Take over-the-counter and prescription medicines only as told by your health care provider. Aspirin or blood thinners (antiplatelets or anticoagulants) may be recommended to reduce your risk of forming blood clots that can lead to stroke. Avoid taking birth control pills. Talk to your health care provider about the risks of taking birth control pills if: You are over 35 years old. You smoke. You get very bad headaches. You have had a blood clot. Where to find more information American Stroke Association: www.strokeassociation.org Get help right away if: You or a loved one has any symptoms of a stroke. "BE FAST" is an easy way to remember the main warning signs of a stroke: B - Balance. Signs are dizziness, sudden trouble walking, or loss of balance. E - Eyes. Signs are trouble seeing or a sudden change in vision. F - Face. Signs are sudden weakness or numbness of the face, or the face or eyelid drooping on one side. A - Arms. Signs are weakness or numbness in an arm. This happens  suddenly and usually on one side of the body. S - Speech. Signs are sudden trouble speaking, slurred speech, or trouble understanding what people say. T - Time. Time to call emergency services. Write down what time symptoms started. You or a loved one has other signs of a stroke, such as: A sudden, severe headache with no known cause. Nausea or vomiting. Seizure. These symptoms may represent a serious problem that is an emergency. Do not wait to see if the symptoms will go away. Get medical help right away. Call your local emergency services (911 in the [place] Do not drive yourself to the hospital. Summary You can help to prevent a stroke by eating healthy, exercising, not smoking, limiting alcohol intake, and managing any medical conditions you may have. Do not use any products that contain nicotine or tobacco. These include cigarettes, chewing tobacco, and vaping devices, such as e-cigarettes. If you need help quitting, ask your health care provider. Remember "BE FAST" for warning signs of a stroke. Get help right away if you or a loved one has any of these signs. This information is not intended to replace advice given to you by your health care provider. Make sure you discuss any questions you have with your health care provider. Document Revised: 07/16/2022 Document Reviewed: 07/16/2022 Elsevier Patient Education  2024 Elsevier Inc.  

## 2023-09-17 NOTE — Progress Notes (Signed)
Subjective:    Patient ID: Frank Hayden, male    DOB: 1955-12-20, 68 y.o.   MRN: [id number]  Chief Complaint  Patient presents with   Annual Exam    Nasty head cold taken meds otc for a week and is no better. Since stroke he has been sweating at night.   PT presents to the office today for chronic follow up. He had a meningioma partially removed in 02/1999 that involved is right sinus cavity, optic nerve, and carotid artery. He completed radiation. He is followed by a Neurosurgeon annually to continue to monitor and has another meningioma.    He is followed by Dermatologists every 6 months. He is followed by Ophthalmologist every 4 months.   He is followed by ENT for chronic sinusitis and attic perforation of TM.   He had a TIA on 01/04/23. He saw a neurologists who said to follow up as needed.  Sinusitis This is a chronic problem. The current episode started more than 1 year ago. The problem has been waxing and waning since onset. Associated symptoms include congestion, coughing, headaches, shortness of breath (only when coughing), sinus pressure, sneezing and a sore throat. Past treatments include oral decongestants and nasal decongestants. The treatment provided mild relief.  Anxiety Presents for follow-up visit. Symptoms include excessive worry, nervous/anxious behavior and shortness of breath (only when coughing). Symptoms occur occasionally. The severity of symptoms is mild.    Hyperlipidemia This is a chronic problem. The current episode started more than 1 year ago. The problem is controlled. Recent lipid tests were reviewed and are normal. Associated symptoms include shortness of breath (only when coughing). Current antihyperlipidemic treatment includes statins. The current treatment provides moderate improvement of lipids. Risk factors for coronary artery disease include dyslipidemia and male sex.      Review of Systems  HENT:  Positive for congestion, sinus pressure,  sneezing and sore throat.   Respiratory:  Positive for cough and shortness of breath (only when coughing).   Neurological:  Positive for headaches.  Psychiatric/Behavioral:  The patient is nervous/anxious.   All other systems reviewed and are negative.  Social History   Socioeconomic History   Marital status: Married    Spouse name: Not on file   Number of children: Not on file   Years of education: Not on file   Highest education level: 12th grade  Occupational History   Not on file  Tobacco Use   Smoking status: Former   Smokeless tobacco: Never   Tobacco comments:    quit 40 years ago  Vaping Use   Vaping status: Never Used  Substance and Sexual Activity   Alcohol use: No   Drug use: No   Sexual activity: Not on file  Other Topics Concern   Not on file  Social History Narrative   Not on file   Social Drivers of Health   Financial Resource Strain: Low Risk  (09/13/2023)   Overall Financial Resource Strain (CARDIA)    Difficulty of Paying Living Expenses: Not hard at all  Food Insecurity: No Food Insecurity (09/13/2023)   Hunger Vital Sign    Worried About Running Out of Food in the Last Year: Never true    Ran Out of Food in the Last Year: Never true  Transportation Needs: No Transportation Needs (09/13/2023)   PRAPARE - Administrator, Civil Service (Medical): No    Lack of Transportation (Non-Medical): No  Physical Activity: Insufficiently Active (09/13/2023)   Exercise  Vital Sign    Days of Exercise per Week: 3 days    Minutes of Exercise per Session: 30 min  Stress: No Stress Concern Present (09/13/2023)   [name] of Occupational Health - Occupational Stress Questionnaire    Feeling of Stress : Not at all  Social Connections: Moderately Integrated (09/13/2023)   Social Connection and Isolation Panel [NHANES]    Frequency of Communication with Friends and Family: More than three times a week    Frequency of Social Gatherings with Friends and  Family: Twice a week    Attends Religious Services: More than 4 times per year    Active Member of [employer] or Organizations: No    Attends Engineer, structural: Not on file    Marital Status: Married   Family History  Problem Relation Age of Onset   Heart disease Mother        Heart enlarged and mechanical valve surgery.   Hypertension Mother    Heart disease Father        5 bypass   Hypertension Father        Objective:   Physical Exam Vitals reviewed.  Constitutional:      General: He is not in acute distress.    Appearance: He is well-developed.  HENT:     Head: Normocephalic.     Right Ear: There is impacted cerumen.     Left Ear: External ear normal. A middle ear effusion is present.     Nose:     Right Sinus: Frontal sinus tenderness present.     Left Sinus: Frontal sinus tenderness present.  Eyes:     General:        Right eye: No discharge.        Left eye: No discharge.     Pupils: Pupils are equal, round, and reactive to light.     Comments: Right eye droop  Neck:     Thyroid: No thyromegaly.  Cardiovascular:     Rate and Rhythm: Normal rate and regular rhythm.     Heart sounds: Normal heart sounds. No murmur heard. Pulmonary:     Effort: Pulmonary effort is normal. No respiratory distress.     Breath sounds: Normal breath sounds. No wheezing.  Abdominal:     General: Bowel sounds are normal. There is no distension.     Palpations: Abdomen is soft.     Tenderness: There is no abdominal tenderness.  Musculoskeletal:        General: No tenderness. Normal range of motion.     Cervical back: Normal range of motion and neck supple.  Skin:    General: Skin is warm and dry.     Findings: No erythema or rash.  Neurological:     Mental Status: He is alert and oriented to person, place, and time.     Cranial Nerves: No cranial nerve deficit.     Deep Tendon Reflexes: Reflexes are normal and symmetric.  Psychiatric:        Behavior: Behavior normal.         Thought Content: Thought content normal.        Judgment: Judgment normal.       BP 123/66   Pulse 79   Temp 97.8 F (36.6 C) (Temporal)   Ht 6\' 1"  (1.854 m)   Wt 181 lb 6.4 oz (82.3 kg)   SpO2 99%   BMI 23.93 kg/m      Assessment & Plan:  Frank Cury  Hayden comes in today with chief complaint of Annual Exam (Nasty head cold taken meds otc for a week and is no better. Since stroke he has been sweating at night.)   Diagnosis and orders addressed:  1. Annual physical exam (Primary) - CMP14+EGFR - CBC with Differential/Platelet - Lipid panel - PSA, total and free  2. 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 Chronic sinusitis, unspecified location - CMP14+EGFR - CBC with Differential/Platelet - doxycycline (VIBRA-TABS) 100 MG tablet; Take 1 tablet (100 mg total) by mouth 2 (two) times daily.  Dispense: 20 tablet; Refill: 0  4. GAD (generalized anxiety disorder) - CMP14+EGFR - CBC with Differential/Platelet  5. Hx-TIA (transient ischemic attack) - CMP14+EGFR - CBC with Differential/Platelet  6. Mixed hyperlipidemia - CMP14+EGFR - CBC with Differential/Platelet - Lipid panel   Labs pending Continue current medications  Keep follow up with specialists  Health Maintenance reviewed Diet and exercise encouraged  Follow up plan: 6 months    [name], FNP

## 2023-09-18 LAB — COLOGUARD: COLOGUARD: NEGATIVE

## 2023-09-20 ENCOUNTER — Other Ambulatory Visit (HOSPITAL_COMMUNITY): Payer: Self-pay | Admitting: Neurosurgery

## 2023-09-20 DIAGNOSIS — D329 Benign neoplasm of meninges, unspecified: Secondary | ICD-10-CM

## 2023-09-24 ENCOUNTER — Other Ambulatory Visit: Payer: BC Managed Care – PPO

## 2023-09-24 DIAGNOSIS — E782 Mixed hyperlipidemia: Secondary | ICD-10-CM | POA: Diagnosis not present

## 2023-09-24 DIAGNOSIS — D329 Benign neoplasm of meninges, unspecified: Secondary | ICD-10-CM | POA: Diagnosis not present

## 2023-09-24 DIAGNOSIS — J329 Chronic sinusitis, unspecified: Secondary | ICD-10-CM | POA: Diagnosis not present

## 2023-09-24 DIAGNOSIS — Z8673 Personal history of transient ischemic attack (TIA), and cerebral infarction without residual deficits: Secondary | ICD-10-CM | POA: Diagnosis not present

## 2023-09-24 DIAGNOSIS — F411 Generalized anxiety disorder: Secondary | ICD-10-CM | POA: Diagnosis not present

## 2023-09-24 LAB — LIPID PANEL

## 2023-09-25 LAB — CMP14+EGFR
ALT: 23 IU/L (ref 0–44)
AST: 22 IU/L (ref 0–40)
Albumin: 4.4 g/dL (ref 3.9–4.9)
Alkaline Phosphatase: 81 IU/L (ref 44–121)
BUN/Creatinine Ratio: 13 (ref 10–24)
BUN: 12 mg/dL (ref 8–27)
Bilirubin Total: 0.5 mg/dL (ref 0.0–1.2)
CO2: 23 mmol/L (ref 20–29)
Calcium: 9.7 mg/dL (ref 8.6–10.2)
Chloride: 101 mmol/L (ref 96–106)
Creatinine, Ser: 0.89 mg/dL (ref 0.76–1.27)
Globulin, Total: 2.2 g/dL (ref 1.5–4.5)
Glucose: 83 mg/dL (ref 70–99)
Potassium: 4 mmol/L (ref 3.5–5.2)
Sodium: 142 mmol/L (ref 134–144)
Total Protein: 6.6 g/dL (ref 6.0–8.5)
eGFR: 93 mL/min/{1.73_m2} (ref >59)

## 2023-09-25 LAB — CBC WITH DIFFERENTIAL/PLATELET
Basophils Absolute: 0 10*3/uL (ref 0.0–0.2)
Basos: 1 %
EOS (ABSOLUTE): 0.2 10*3/uL (ref 0.0–0.4)
Eos: 3 %
Hematocrit: 46.8 % (ref 37.5–51.0)
Hemoglobin: 15.6 g/dL (ref 13.0–17.7)
Immature Grans (Abs): 0 10*3/uL (ref 0.0–0.1)
Immature Granulocytes: 0 %
Lymphocytes Absolute: 1.1 10*3/uL (ref 0.7–3.1)
Lymphs: 21 %
MCH: 30.1 pg (ref 26.6–33.0)
MCHC: 33.3 g/dL (ref 31.5–35.7)
MCV: 90 fL (ref 79–97)
Monocytes Absolute: 0.4 10*3/uL (ref 0.1–0.9)
Monocytes: 7 %
Neutrophils Absolute: 3.6 10*3/uL (ref 1.4–7.0)
Neutrophils: 68 %
Platelets: 203 10*3/uL (ref 150–450)
RBC: 5.18 x10E6/uL (ref 4.14–5.80)
RDW: 13.2 % (ref 11.6–15.4)
WBC: 5.2 10*3/uL (ref 3.4–10.8)

## 2023-09-25 LAB — LIPID PANEL
Cholesterol, Total: 177 mg/dL (ref 100–199)
HDL: 39 mg/dL — ABNORMAL LOW (ref >39)
LDL CALC COMMENT:: 4.5 ratio (ref 0.0–5.0)
LDL Chol Calc (NIH): 103 mg/dL — ABNORMAL HIGH (ref 0–99)
Triglycerides: 203 mg/dL — ABNORMAL HIGH (ref 0–149)
VLDL Cholesterol Cal: 35 mg/dL (ref 5–40)

## 2023-09-25 LAB — PSA, TOTAL AND FREE
PSA, Free Pct: 55 %
PSA, Free: 0.77 ng/mL
Prostate Specific Ag, Serum: 1.4 ng/mL (ref 0.0–4.0)

## 2023-09-27 ENCOUNTER — Ambulatory Visit (HOSPITAL_COMMUNITY)
Admission: RE | Admit: 2023-09-27 | Discharge: 2023-09-27 | Disposition: A | Discharge status: Home / Self Care | Payer: BC Managed Care – PPO | Source: Ambulatory Visit | Attending: Neurosurgery | Admitting: Neurosurgery

## 2023-09-27 DIAGNOSIS — I6782 Cerebral ischemia: Secondary | ICD-10-CM | POA: Diagnosis not present

## 2023-09-27 DIAGNOSIS — D329 Benign neoplasm of meninges, unspecified: Secondary | ICD-10-CM | POA: Insufficient documentation

## 2023-09-27 DIAGNOSIS — G9389 Other specified disorders of brain: Secondary | ICD-10-CM | POA: Diagnosis not present

## 2023-09-27 DIAGNOSIS — J320 Chronic maxillary sinusitis: Secondary | ICD-10-CM | POA: Diagnosis not present

## 2023-09-27 MED ORDER — GADOBUTROL 1 MMOL/ML IV SOLN
8.0000 mL | Freq: Once | INTRAVENOUS | Status: AC | PRN
  Administered 2023-09-27: 8 mL via INTRAVENOUS

## 2023-11-15 DIAGNOSIS — L814 Other melanin hyperpigmentation: Secondary | ICD-10-CM | POA: Diagnosis not present

## 2023-11-15 DIAGNOSIS — L570 Actinic keratosis: Secondary | ICD-10-CM | POA: Diagnosis not present

## 2023-11-15 DIAGNOSIS — L821 Other seborrheic keratosis: Secondary | ICD-10-CM | POA: Diagnosis not present

## 2023-11-21 ENCOUNTER — Telehealth: Payer: Self-pay | Admitting: Family

## 2023-11-21 NOTE — Telephone Encounter (Signed)
 Patient's spouse says that pt has lost confidence in [place]. He feels like Neysa Bonito does not listen to him during the office visits. He says there is no communication with provider. Spouse says that pt is no longer on CSA. It expired in January 2025. He would like to switch to [facility]. He says that he seen her for an ov and he felt that she listen to him during the office visit. Is it ok to switch?   Copied from CRM [id number]. Topic: General - Other >> Nov 21, 2023  2:54 PM [place] B wrote: Reason for CRM: pt's wife called in states pt doesn't want to see Dr Lendon Colonel anymore , he wants a different Dr at this office.

## 2023-11-21 NOTE — Telephone Encounter (Signed)
 This is fine with me

## 2023-11-26 DIAGNOSIS — D329 Benign neoplasm of meninges, unspecified: Secondary | ICD-10-CM | POA: Diagnosis not present

## 2023-12-02 DIAGNOSIS — H16231 Neurotrophic keratoconjunctivitis, right eye: Secondary | ICD-10-CM | POA: Diagnosis not present

## 2023-12-02 DIAGNOSIS — H538 Other visual disturbances: Secondary | ICD-10-CM | POA: Diagnosis not present

## 2023-12-02 DIAGNOSIS — H168 Other keratitis: Secondary | ICD-10-CM | POA: Diagnosis not present

## 2023-12-02 DIAGNOSIS — H4720 Unspecified optic atrophy: Secondary | ICD-10-CM | POA: Diagnosis not present

## 2023-12-05 NOTE — Telephone Encounter (Signed)
 Spouse asked to switch to [facility]. Spouse aware will call back with decision.

## 2023-12-11 NOTE — Telephone Encounter (Signed)
 Told pt that none of the providers are willing to take him as a pt.

## 2023-12-25 ENCOUNTER — Telehealth: Payer: Self-pay

## 2024-01-01 ENCOUNTER — Encounter (HOSPITAL_COMMUNITY): Payer: Self-pay

## 2024-01-08 ENCOUNTER — Ambulatory Visit: Admitting: Nurse Practitioner

## 2024-01-08 ENCOUNTER — Encounter: Payer: Self-pay | Admitting: Nurse Practitioner

## 2024-01-08 VITALS — BP 134/71 | HR 66 | Temp 98.1°F | Ht 73.0 in | Wt 180.4 lb

## 2024-01-08 DIAGNOSIS — Z0001 Encounter for general adult medical examination with abnormal findings: Secondary | ICD-10-CM | POA: Diagnosis not present

## 2024-01-08 DIAGNOSIS — E559 Vitamin D deficiency, unspecified: Secondary | ICD-10-CM | POA: Diagnosis not present

## 2024-01-08 DIAGNOSIS — E782 Mixed hyperlipidemia: Secondary | ICD-10-CM

## 2024-01-08 NOTE — Progress Notes (Signed)
 New Patient Office Visit  Subjective   Patient ID: Frank Hayden, male    DOB: 1956-01-14  Age: 68 y.o. MRN: [id number]  CC:  Chief Complaint  Patient presents with   Establish Care    HPI Frank Hayden is 68 yrs old male presents  12/09/2023 to establish care, no concerns. Frank Hayden was being seen by [facility], last seen by her 09/17/2023. This is his first encounter with the writer to establish care. Hx of brain tumor 24 yrs that affect the right eyes. TIA 2024.  meningioma of the right sphenoid wing involving the cavernous sinus, Acute on chronic bacterial sinusitis  Hyperlipidemia  Lipid/Cholesterol  Last lipid panel Other pertinent labs  Lab Results  Component Value Date   CHOL 177 09/24/2023   HDL 39 (L) 09/24/2023   LDLCALC 103 (H) 09/24/2023   TRIG 203 (H) 09/24/2023   CHOLHDL 4.5 09/24/2023   Lab Results  Component Value Date   ALT 23 09/24/2023   AST 22 09/24/2023   PLT 203 09/24/2023   TSH 0.814 06/11/2022     He was last seen for this 4 months ago.  Management since that visit includes Crestor  40 mg daily. He reports excellent compliance with treatment. He is not having side effects.  Symptoms: No chest pain No chest pressure/discomfort  No dyspnea No lower extremity edema  No numbness or tingling of extremity No orthopnea  No palpitations No paroxysmal nocturnal dyspnea  No speech difficulty No syncope   Current diet: not asked, on average, 3 meals per day Current exercise: none  The ASCVD Risk score (Arnett DK, et al., 2019) failed to calculate for the following reasons:   Risk score cannot be calculated because patient has a medical history suggesting prior/existing ASCVD  Vitamin D deficiency  Lab Results  Component Value Date   CALCIUM  9.7 09/24/2023   CALCIUM  9.7 08/26/2023        Wt Readings from Last 3 Encounters:  01/08/24 180 lb 6.4 oz (81.8 kg)  09/17/23 181 lb 6.4 oz (82.3 kg)  05/03/23 175 lb 9.6 oz (79.7 kg)   He was last seen  for vitamin D deficiency 4 months ago.  Management since that visit includes Vit D supplement 2000 units. He reports excellent compliance with treatment. He is not having side effects.   Symptoms: No change in energy level No numbness or tingling  No bone pain No unexplained fracture   ---------------------------------------------------------------------------------------------------      01/08/2024   12:14 PM 09/17/2023    9:11 AM 01/08/2023    2:04 PM  PHQ9 SCORE ONLY  PHQ-9 Total Score 0 0 0       01/08/2024   12:14 PM 09/17/2023    9:11 AM 01/08/2023    2:04 PM 12/31/2022   12:18 PM  GAD 7 : Generalized Anxiety Score  Nervous, Anxious, on Edge 0 0 0 0  Control/stop worrying 0 0 0 0  Worry too much - different things 0 0 0 0  Trouble relaxing 0 0 0 0  Restless 0 0 0 0  Easily annoyed or irritable 0 0 0 0  Afraid - awful might happen 0 0 0 0  Total GAD 7 Score 0 0 0 0  Anxiety Difficulty Not difficult at all Not difficult at all Not difficult at all Not difficult at all    Relevant past medical, surgical, family, and social history reviewed and updated as indicated.  Allergies and medications reviewed and updated.  Data reviewed: Chart in Epic.    Outpatient Encounter Medications as of 01/08/2024  Medication Sig   acetaminophen  (TYLENOL ) 500 MG tablet Take 1 tablet (500 mg total) by mouth every 6 (six) hours as needed.   albuterol  (VENTOLIN  HFA) 108 (90 Base) MCG/ACT inhaler Inhale 2 puffs into the lungs every 6 (six) hours as needed for wheezing or shortness of breath.   aspirin  EC 81 MG tablet Take 1 tablet (81 mg total) by mouth daily. Swallow whole.   Bepotastine Besilate 1.5 % SOLN    Cholecalciferol (VITAMIN D) 2000 units CAPS Take by mouth.   cycloSPORINE  (RESTASIS ) 0.05 % ophthalmic emulsion 1 drop 2 (two) times daily.   fluticasone  (FLONASE ) 50 MCG/ACT nasal spray Place 2 sprays into both nostrils daily.   levocetirizine (XYZAL ) 5 MG tablet Take 5 mg by mouth every  evening.   MIEBO  1.338 GM/ML SOLN Apply 1 drop to eye 2 (two) times daily.   OVER THE COUNTER MEDICATION Place 1 drop into the right eye 4 (four) times daily. Losartan potassium (PF) (0.5 ml) (0.8 mg/ml) oph soln     Pt spouse states this eye drop was compounded for pt.   Potassium 99 MG TABS Take 1 tablet by mouth daily.   rosuvastatin  (CRESTOR ) 40 MG tablet Take 1 tablet (40 mg total) by mouth daily.   TYRVAYA  0.03 MG/ACT SOLN Place 1 spray into both nostrils 2 (two) times daily.   vitamin B-12 (CYANOCOBALAMIN ) 1000 MCG tablet Take 1,000 mcg by mouth daily.   vitamin E 400 UNIT capsule Take 400 Units by mouth daily.   [DISCONTINUED] doxycycline  (VIBRA -TABS) 100 MG tablet Take 1 tablet (100 mg total) by mouth 2 (two) times daily.   No facility-administered encounter medications on file as of 01/08/2024.    Past Medical History:  Diagnosis Date   Anxiety    Has headaches associated with past surgery.   Cancer (HCC)    Brain    Hearing loss    decreased 95% right / decreased 24% left ear    Past Surgical History:  Procedure Laterality Date   APPENDECTOMY     EYE SURGERY Right    Tumor removed behind eye.   TONSILLECTOMY      Family History  Problem Relation Age of Onset   Heart disease Mother        Heart enlarged and mechanical valve surgery.   Hypertension Mother    Heart disease Father        5 bypass   Hypertension Father     Social History   Socioeconomic History   Marital status: Married    Spouse name: Not on file   Number of children: Not on file   Years of education: Not on file   Highest education level: 12th grade  Occupational History   Not on file  Tobacco Use   Smoking status: Former   Smokeless tobacco: Never   Tobacco comments:    quit 40 years ago  Vaping Use   Vaping status: Never Used  Substance and Sexual Activity   Alcohol use: No   Drug use: No   Sexual activity: Not on file  Other Topics Concern   Not on file  Social History  Narrative   Not on file   Social Drivers of Health   Financial Resource Strain: Low Risk  (09/13/2023)   Overall Financial Resource Strain (CARDIA)    Difficulty of Paying Living Expenses: Not hard at all  Food Insecurity: No Food  Insecurity (09/13/2023)   Hunger Vital Sign    Worried About Running Out of Food in the Last Year: Never true    Ran Out of Food in the Last Year: Never true  Transportation Needs: No Transportation Needs (09/13/2023)   PRAPARE - Administrator, Civil Service (Medical): No    Lack of Transportation (Non-Medical): No  Physical Activity: Insufficiently Active (09/13/2023)   Exercise Vital Sign    Days of Exercise per Week: 3 days    Minutes of Exercise per Session: 30 min  Stress: No Stress Concern Present (09/13/2023)   [name] of Occupational Health - Occupational Stress Questionnaire    Feeling of Stress : Not at all  Social Connections: Moderately Integrated (09/13/2023)   Social Connection and Isolation Panel [NHANES]    Frequency of Communication with Friends and Family: More than three times a week    Frequency of Social Gatherings with Friends and Family: Twice a week    Attends Religious Services: More than 4 times per year    Active Member of [employer] or Organizations: No    Attends Banker Meetings: Not on file    Marital Status: Married  Catering manager Violence: Not At Risk (01/05/2023)   Humiliation, Afraid, Rape, and Kick questionnaire    Fear of Current or Ex-Partner: No    Emotionally Abused: No    Physically Abused: No    Sexually Abused: No    Review of Systems  Constitutional:  Negative for chills and fever.  HENT:  Negative for congestion and sore throat.   Respiratory:  Negative for cough, shortness of breath and wheezing.   Cardiovascular:  Negative for chest pain and leg swelling.  Gastrointestinal:  Negative for constipation, diarrhea, nausea and vomiting.  Musculoskeletal:  Negative for falls.   Skin:  Negative for itching and rash.  Neurological:  Negative for dizziness.  Psychiatric/Behavioral:  The patient does not have insomnia.    Negative unless indicated in HPI    Objective   BP 134/71   Pulse 66   Temp 98.1 F (36.7 C) (Temporal)   Ht 6\' 1"  (1.854 m)   Wt 180 lb 6.4 oz (81.8 kg)   SpO2 99%   BMI 23.80 kg/m   Physical Exam Vitals and nursing note reviewed.  Constitutional:      Appearance: He is not toxic-appearing.  HENT:     Head: Normocephalic and atraumatic.     Right Ear: Tympanic membrane, ear canal and external ear normal. There is no impacted cerumen.     Left Ear: Tympanic membrane, ear canal and external ear normal. There is no impacted cerumen.     Nose: Nose normal.     Mouth/Throat:     Mouth: Mucous membranes are moist.  Eyes:     General: No scleral icterus.    Extraocular Movements: Extraocular movements intact.     Conjunctiva/sclera: Conjunctivae normal.     Pupils: Pupils are equal, round, and reactive to light.  Neck:     Vascular: No carotid bruit.  Cardiovascular:     Heart sounds: Normal heart sounds.  Pulmonary:     Effort: Pulmonary effort is normal.     Breath sounds: Normal breath sounds.  Abdominal:     General: Bowel sounds are normal.     Palpations: Abdomen is soft.  Musculoskeletal:        General: Normal range of motion.     Cervical back: Normal range of motion and  neck supple. No rigidity or tenderness.     Right lower leg: No edema.     Left lower leg: No edema.  Lymphadenopathy:     Cervical: No cervical adenopathy.  Skin:    General: Skin is warm and dry.     Findings: No rash.  Neurological:     Mental Status: He is alert and oriented to person, place, and time.  Psychiatric:        Mood and Affect: Mood normal.        Behavior: Behavior normal.        Thought Content: Thought content normal.        Judgment: Judgment normal.     Last CBC Lab Results  Component Value Date   WBC 5.2 09/24/2023    HGB 15.6 09/24/2023   HCT 46.8 09/24/2023   MCV 90 09/24/2023   MCH 30.1 09/24/2023   RDW 13.2 09/24/2023   PLT 203 09/24/2023   Last metabolic panel Lab Results  Component Value Date   GLUCOSE 83 09/24/2023   NA 142 09/24/2023   K 4.0 09/24/2023   CL 101 09/24/2023   CO2 23 09/24/2023   BUN 12 09/24/2023   CREATININE 0.89 09/24/2023   EGFR 93 09/24/2023   CALCIUM  9.7 09/24/2023   PROT 6.6 09/24/2023   ALBUMIN 4.4 09/24/2023   LABGLOB 2.2 09/24/2023   AGRATIO 2.4 (H) 06/11/2022   BILITOT 0.5 09/24/2023   ALKPHOS 81 09/24/2023   AST 22 09/24/2023   ALT 23 09/24/2023   ANIONGAP 10 01/04/2023   Last lipids Lab Results  Component Value Date   CHOL 177 09/24/2023   HDL 39 (L) 09/24/2023   LDLCALC 103 (H) 09/24/2023   TRIG 203 (H) 09/24/2023   CHOLHDL 4.5 09/24/2023   Last hemoglobin A1c Lab Results  Component Value Date   HGBA1C 5.9 (H) 01/05/2023   Last thyroid  functions Lab Results  Component Value Date   TSH 0.814 06/11/2022   T4TOTAL 8.0 05/12/2021     Assessment & Plan:  Mixed hyperlipidemia -     Lipid panel  Vitamin D deficiency -     VITAMIN D 25 Hydroxy (Vit-D Deficiency, Fractures)  Encounter for general adult medical examination with abnormal findings -     Lipid panel -     VITAMIN D 25 Hydroxy (Vit-D Deficiency, Fractures)  Frank Hayden is 68 yrs old Caucasian amle seen today to establish care Hyperlipidemia: Recheck Lipid, continue Crestor  40 mg daily, no refill needed, add fish oil daily  Vit D: recheck D level, continue D supplement, no refill needed Labs: Lipid, Vid D result pending   The above assessment and management plan was discussed with the patient. The patient verbalized understanding of and has agreed to the management plan. Patient is aware to call the clinic if they develop any new symptoms or if symptoms persist or worsen. Patient is aware when to return to the clinic for a follow-up visit. Patient educated on when it is  appropriate to go to the emergency department.  Return in about 6 months (around 07/10/2024).   Frank Ridlon [place] Thompson, DNP [facility] [address] [place], [place] [address] [phone]  Note: This document was prepared by [employer] voice dictation technology and any errors that results from this process are unintentional.

## 2024-01-09 LAB — LIPID PANEL
Chol/HDL Ratio: 3.9 ratio (ref 0.0–5.0)
Cholesterol, Total: 173 mg/dL (ref 100–199)
HDL: 44 mg/dL (ref >39)
LDL Chol Calc (NIH): 93 mg/dL (ref 0–99)
Triglycerides: 213 mg/dL — ABNORMAL HIGH (ref 0–149)
VLDL Cholesterol Cal: 36 mg/dL (ref 5–40)

## 2024-01-09 LAB — VITAMIN D 25 HYDROXY (VIT D DEFICIENCY, FRACTURES): Vit D, 25-Hydroxy: 55.2 ng/mL (ref 30.0–100.0)

## 2024-01-14 ENCOUNTER — Ambulatory Visit: Payer: Self-pay | Admitting: Nurse Practitioner

## 2024-02-19 DIAGNOSIS — H04121 Dry eye syndrome of right lacrimal gland: Secondary | ICD-10-CM | POA: Diagnosis not present

## 2024-02-19 DIAGNOSIS — H16231 Neurotrophic keratoconjunctivitis, right eye: Secondary | ICD-10-CM | POA: Diagnosis not present

## 2024-02-19 DIAGNOSIS — H01026 Squamous blepharitis left eye, unspecified eyelid: Secondary | ICD-10-CM | POA: Diagnosis not present

## 2024-02-19 DIAGNOSIS — H16211 Exposure keratoconjunctivitis, right eye: Secondary | ICD-10-CM | POA: Diagnosis not present

## 2024-03-16 ENCOUNTER — Ambulatory Visit: Payer: BC Managed Care – PPO | Admitting: Family

## 2024-03-30 DIAGNOSIS — Z961 Presence of intraocular lens: Secondary | ICD-10-CM | POA: Diagnosis not present

## 2024-03-30 DIAGNOSIS — H4720 Unspecified optic atrophy: Secondary | ICD-10-CM | POA: Diagnosis not present

## 2024-03-30 DIAGNOSIS — H16231 Neurotrophic keratoconjunctivitis, right eye: Secondary | ICD-10-CM | POA: Diagnosis not present

## 2024-03-30 DIAGNOSIS — H04129 Dry eye syndrome of unspecified lacrimal gland: Secondary | ICD-10-CM | POA: Diagnosis not present

## 2024-04-15 DIAGNOSIS — H01023 Squamous blepharitis right eye, unspecified eyelid: Secondary | ICD-10-CM | POA: Diagnosis not present

## 2024-04-15 DIAGNOSIS — H16231 Neurotrophic keratoconjunctivitis, right eye: Secondary | ICD-10-CM | POA: Diagnosis not present

## 2024-04-15 DIAGNOSIS — H16211 Exposure keratoconjunctivitis, right eye: Secondary | ICD-10-CM | POA: Diagnosis not present

## 2024-04-15 DIAGNOSIS — H04121 Dry eye syndrome of right lacrimal gland: Secondary | ICD-10-CM | POA: Diagnosis not present

## 2024-04-15 DIAGNOSIS — H02051 Trichiasis without entropian right upper eyelid: Secondary | ICD-10-CM | POA: Diagnosis not present

## 2024-05-14 DIAGNOSIS — L821 Other seborrheic keratosis: Secondary | ICD-10-CM | POA: Diagnosis not present

## 2024-05-14 DIAGNOSIS — Z85828 Personal history of other malignant neoplasm of skin: Secondary | ICD-10-CM | POA: Diagnosis not present

## 2024-05-14 DIAGNOSIS — Z08 Encounter for follow-up examination after completed treatment for malignant neoplasm: Secondary | ICD-10-CM | POA: Diagnosis not present

## 2024-05-14 DIAGNOSIS — L814 Other melanin hyperpigmentation: Secondary | ICD-10-CM | POA: Diagnosis not present

## 2024-07-14 ENCOUNTER — Ambulatory Visit: Payer: Self-pay | Admitting: Nurse Practitioner

## 2024-07-14 ENCOUNTER — Encounter: Payer: Self-pay | Admitting: Nurse Practitioner

## 2024-07-14 VITALS — BP 123/69 | HR 58 | Temp 96.7°F | Ht 73.0 in | Wt 182.2 lb

## 2024-07-14 DIAGNOSIS — E559 Vitamin D deficiency, unspecified: Secondary | ICD-10-CM | POA: Diagnosis not present

## 2024-07-14 DIAGNOSIS — H9203 Otalgia, bilateral: Secondary | ICD-10-CM | POA: Diagnosis not present

## 2024-07-14 DIAGNOSIS — D320 Benign neoplasm of cerebral meninges: Secondary | ICD-10-CM | POA: Diagnosis not present

## 2024-07-14 DIAGNOSIS — E782 Mixed hyperlipidemia: Secondary | ICD-10-CM | POA: Diagnosis not present

## 2024-07-14 MED ORDER — CIPROFLOXACIN-DEXAMETHASONE 0.3-0.1 % OT SUSP: 4.0000 [drp] | Freq: Two times a day (BID) | OTIC | 0 refills | Status: AC

## 2024-07-14 NOTE — Progress Notes (Signed)
 Subjective:  Patient ID: Frank Hayden, male    DOB: 08-Aug-1956, 68 y.o.   MRN: [id number]  Patient Care Team: Deitra Morton Sebastian Nena, NP as PCP - General (Nurse Practitioner)   Chief Complaint:  Medical Management of Chronic Issues (6 month)   HPI: Frank Hayden is a 68 y.o. male presenting on 07/14/2024 for Medical Management of Chronic Issues (6 month)   Discussed the use of AI scribe software for clinical note transcription with the patient, who gave verbal consent to proceed.  History of Present Illness Frank Hayden is a 68 year old male who presents with allergy symptoms and ear discomfort.  He experiences significant allergy symptoms, including rhinorrhea described as 'like a water hose,' occasional throat tickling, and a mild cough. He manages these symptoms with Mucinex liquid for daytime and nighttime, Xyzal , and occasionally Benadryl. He also uses Trivera nasal spray and an eye drop twice daily, though he notes the nasal spray sometimes causes a rough feeling in his throat or a burning sensation in his eyes.  He has ear discomfort due to cerumen buildup and a 'little bit of infection around both ears.' He recalls a previous prescription for ear drops, which involved using five drops for five days, and found it helpful. He requests a prescription for ear drops to manage the current ear discomfort.  He has a history of sinus issues, which he associates with a gamma knife surgery he underwent in 2016 for tumor removal. He describes a challenging experience during the procedure, including a burning sensation in his head. Post-surgery, he was advised that he has residual radiation in his system, affecting his ability to donate blood. He has been monitored regularly since the surgery.  He is currently on cholesterol medication, which was refilled in the middle of last month, and he reports having an adequate supply.       01/08/2024   12:14 PM 09/17/2023     9:11 AM 01/08/2023    2:04 PM 12/31/2022   12:18 PM  GAD 7 : Generalized Anxiety Score  Nervous, Anxious, on Edge 0 0 0 0  Control/stop worrying 0 0 0 0  Worry too much - different things 0 0 0 0  Trouble relaxing 0 0 0 0  Restless 0 0 0 0  Easily annoyed or irritable 0 0 0 0  Afraid - awful might happen 0 0 0 0  Total GAD 7 Score 0 0 0 0  Anxiety Difficulty Not difficult at all Not difficult at all Not difficult at all Not difficult at all        01/08/2024   12:14 PM 09/17/2023    9:11 AM 01/08/2023    2:04 PM  PHQ9 SCORE ONLY  PHQ-9 Total Score 0  0  0      Data saved with a previous flowsheet row definition     Relevant past medical, surgical, family, and social history reviewed and updated as indicated.  Allergies and medications reviewed and updated. Data reviewed: Chart in Epic.   Past Medical History:  Diagnosis Date   Anxiety    Has headaches associated with past surgery.   Cancer (HCC)    Brain    Hearing loss    decreased 95% right / decreased 24% left ear    Past Surgical History:  Procedure Laterality Date   APPENDECTOMY     EYE SURGERY Right    Tumor removed behind eye.   TONSILLECTOMY  Social History   Socioeconomic History   Marital status: Married    Spouse name: Not on file   Number of children: Not on file   Years of education: Not on file   Highest education level: 12th grade  Occupational History   Not on file  Tobacco Use   Smoking status: Former   Smokeless tobacco: Never   Tobacco comments:    quit 40 years ago  Vaping Use   Vaping status: Never Used  Substance and Sexual Activity   Alcohol use: No   Drug use: No   Sexual activity: Not on file  Other Topics Concern   Not on file  Social History Narrative   Not on file   Social Drivers of Health   Financial Resource Strain: Low Risk  (07/10/2024)   Overall Financial Resource Strain (CARDIA)    Difficulty of Paying Living Expenses: Not hard at all  Food Insecurity: No  Food Insecurity (07/10/2024)   Hunger Vital Sign    Worried About Running Out of Food in the Last Year: Never true    Ran Out of Food in the Last Year: Never true  Transportation Needs: No Transportation Needs (07/10/2024)   PRAPARE - Administrator, Civil Service (Medical): No    Lack of Transportation (Non-Medical): No  Physical Activity: Sufficiently Active (07/10/2024)   Exercise Vital Sign    Days of Exercise per Week: 5 days    Minutes of Exercise per Session: 30 min  Stress: No Stress Concern Present (07/10/2024)   [name] of Occupational Health - Occupational Stress Questionnaire    Feeling of Stress: Not at all  Social Connections: Moderately Integrated (07/10/2024)   Social Connection and Isolation Panel    Frequency of Communication with Friends and Family: Three times a week    Frequency of Social Gatherings with Friends and Family: Three times a week    Attends Religious Services: More than 4 times per year    Active Member of Clubs or Organizations: No    Attends Banker Meetings: Not on file    Marital Status: Married  Intimate Partner Violence: Not At Risk (01/05/2023)   Humiliation, Afraid, Rape, and Kick questionnaire    Fear of Current or Ex-Partner: No    Emotionally Abused: No    Physically Abused: No    Sexually Abused: No    Outpatient Encounter Medications as of 07/14/2024  Medication Sig   acetaminophen  (TYLENOL ) 500 MG tablet Take 1 tablet (500 mg total) by mouth every 6 (six) hours as needed.   albuterol  (VENTOLIN  HFA) 108 (90 Base) MCG/ACT inhaler Inhale 2 puffs into the lungs every 6 (six) hours as needed for wheezing or shortness of breath.   aspirin  EC 81 MG tablet Take 1 tablet (81 mg total) by mouth daily. Swallow whole.   Bepotastine Besilate 1.5 % SOLN    Cholecalciferol (VITAMIN D ) 2000 units CAPS Take by mouth.   ciprofloxacin -dexamethasone  (CIPRODEX ) OTIC suspension Place 4 drops into both ears 2 (two) times  daily.   cycloSPORINE  (RESTASIS ) 0.05 % ophthalmic emulsion 1 drop 2 (two) times daily.   fluticasone  (FLONASE ) 50 MCG/ACT nasal spray Place 2 sprays into both nostrils daily.   levocetirizine (XYZAL ) 5 MG tablet Take 5 mg by mouth every evening.   MIEBO  1.338 GM/ML SOLN Apply 1 drop to eye 2 (two) times daily.   Potassium 99 MG TABS Take 1 tablet by mouth daily.   rosuvastatin  (CRESTOR ) 40 MG tablet  Take 1 tablet (40 mg total) by mouth daily.   TYRVAYA  0.03 MG/ACT SOLN Place 1 spray into both nostrils 2 (two) times daily.   vitamin B-12 (CYANOCOBALAMIN ) 1000 MCG tablet Take 1,000 mcg by mouth daily.   vitamin E 400 UNIT capsule Take 400 Units by mouth daily.   [DISCONTINUED] OVER THE COUNTER MEDICATION Place 1 drop into the right eye 4 (four) times daily. Losartan potassium (PF) (0.5 ml) (0.8 mg/ml) oph soln     Pt spouse states this eye drop was compounded for pt. (Patient not taking: Reported on 07/14/2024)   No facility-administered encounter medications on file as of 07/14/2024.    Allergies  Allergen Reactions   Atorvastatin  Other (See Comments)    Muscle aches   Carbamazepine Nausea And Vomiting    Chest tightness Chest tightness    Pregabalin Nausea And Vomiting   Augmentin  [Amoxicillin -Pot Clavulanate] Diarrhea   Latex Dermatitis    Pertinent ROS per HPI, otherwise unremarkable      Objective:  BP 123/69   Pulse (!) 58   Temp (!) 96.7 F (35.9 C) (Temporal)   Ht 6' 1 (1.854 m)   Wt 182 lb 3.2 oz (82.6 kg)   SpO2 96%   BMI 24.04 kg/m    Wt Readings from Last 3 Encounters:  07/14/24 182 lb 3.2 oz (82.6 kg)  01/08/24 180 lb 6.4 oz (81.8 kg)  09/17/23 181 lb 6.4 oz (82.3 kg)    Physical Exam Vitals and nursing note reviewed.  Constitutional:      Appearance: Normal appearance.  HENT:     Head: Normocephalic and atraumatic.     Right Ear: Swelling and tenderness present.     Left Ear: Swelling and tenderness present.     Mouth/Throat:     Mouth: Mucous  membranes are moist.  Eyes:     General: No scleral icterus.    Extraocular Movements: Extraocular movements intact.     Conjunctiva/sclera: Conjunctivae normal.     Pupils: Pupils are equal, round, and reactive to light.  Cardiovascular:     Heart sounds: Normal heart sounds.  Pulmonary:     Effort: Pulmonary effort is normal.     Breath sounds: Normal breath sounds.  Musculoskeletal:        General: Normal range of motion.     Right lower leg: No edema.     Left lower leg: No edema.  Skin:    General: Skin is warm and dry.  Neurological:     Mental Status: He is alert and oriented to person, place, and time.  Psychiatric:        Mood and Affect: Mood normal.        Behavior: Behavior normal.        Thought Content: Thought content normal.        Judgment: Judgment normal.    Physical Exam      Results for orders placed or performed in visit on 01/08/24  Lipid panel   Collection Time: 01/08/24  1:22 PM  Result Value Ref Range   Cholesterol, Total 173 100 - 199 mg/dL   Triglycerides 786 (H) 0 - 149 mg/dL   HDL 44 >60 mg/dL   VLDL Cholesterol Cal 36 5 - 40 mg/dL   LDL Chol Calc (NIH) 93 0 - 99 mg/dL   Chol/HDL Ratio 3.9 0.0 - 5.0 ratio  VITAMIN D  25 Hydroxy (Vit-D Deficiency, Fractures)   Collection Time: 01/08/24  1:22 PM  Result Value Ref Range  Vit D, 25-Hydroxy 55.2 30.0 - 100.0 ng/mL       Pertinent labs & imaging results that were available during my care of the patient were reviewed by me and considered in my medical decision making.  Assessment & Plan:  Jonmichael Beadnell was seen today for medical management of chronic issues.  Diagnoses and all orders for this visit:  Otalgia of both ears -     ciprofloxacin -dexamethasone  (CIPRODEX ) OTIC suspension; Place 4 drops into both ears 2 (two) times daily.  Mixed hyperlipidemia  Vitamin D  deficiency  Benign neoplasm of cerebral meninges (HCC)     Assessment and Plan Frank Hayden is a 68 yrs old Caucasian  male seen today fro chronic management, no acute distress Assessment & Plan Bilateral otalgia and ear canal infection with cerumen impaction Bilateral ear pain with cerumen impaction and infection. Previous ear drops effective. Insurance may not cover ear drops for cerumen impaction. - Prescribed Ciprodex  for ear pain.  Allergic rhinitis Chronic allergic rhinitis with rhinorrhea, throat tickle, and occasional cough. - Continue Xyzal  and Trivera nasal spray.  Mixed hyperlipidemia - Continue Crestor  40 mg daily - refill  provided   Vitamin D  Deficiency - Continue 200 units daily - No refills needed  Benign neoplasm of cerebral meninges  - no new symptoms; resolved  Future Labs at next visit  Continue all other maintenance medications.  Follow up plan: Return in about 6 months (around 01/11/2025) for Chronic Diseases Management.   Continue healthy lifestyle choices, including diet (rich in fruits, vegetables, and lean proteins, and low in salt and simple carbohydrates) and exercise (at least 30 minutes of moderate physical activity daily).  Educational handout given for    Clinical References  Meningioma  A meningioma is an abnormal growth of cells (tumor) that occurs in the meninges. The meninges are the thin tissues that cover the brain and spinal cord. A meningioma is usually not cancer (benign). In rare cases, a meningioma is cancerous (malignant). What are the causes? This condition may be caused by: Being exposed to ionizing radiation. This type of energy occurs naturally in the environment. It also comes from artificial sources, such as from X-rays and some medical devices. Neurofibromatosis 2. This is a genetic disorder that causes many soft tumors. Genetic mutations. These are changes in certain genes. Many times, the cause of this condition is not known. What increases the risk? The following factors may make you more likely to develop this condition: Being  exposed to radiation, especially as a child. Being male. There may be a greater risk linked to having male hormones. Older people who are male have a higher risk of meningiomas than people who are male or children. People who are male have a higher risk of malignant meningiomas. Being obese. What are the signs or symptoms? Common symptoms of this condition include: Headaches. Nausea and vomiting. Vision changes. Hearing changes. Loss of the sense of smell. Other symptoms include: Seizures. Weakness or numbness on one side of the body, or in an arm or a leg. Problems with memory or thinking. Mood or personality changes. Symptoms of this condition usually begin very slowly. The symptoms may depend on the size and location of your tumor. How is this diagnosed? This condition is diagnosed based on: Results of brain imaging tests, such as a CT scan or an MRI. Removing a tissue sample to look at it under a microscope (biopsy). This may be done to confirm the diagnosis and to help determine the best treatment  for your condition. How is this treated? This condition may be treated with: Medicines, such as steroids. These may help lessen brain swelling and and improve your symptoms. Radiation therapy. This uses high-energy rays to shrink or kill your tumor. Surgery to remove as much of your tumor as possible. You may not have treatment until your symptoms start to affect your daily activities. This is because meningiomas grow very slowly. Your health care provider may prefer to watch for growth of your benign tumor before starting treatment. Follow these instructions at home: Take over-the-counter and prescription medicines only as told by your health care provider. Follow instructions from your health care provider about what you may eat and drink. Drink enough fluid to keep your urine pale yellow. Return to your normal activities as told by your health care provider. Ask your health  care provider what activities are safe for you. Keep all follow-up visits. You may need regular visits with your health care provider to watch the growth of your tumor. Contact a health care provider if: You have symptoms that come back after treatment. You have headaches. You vomit. You have changes in your vision. You are weaker or more tired than usual. Get help right away if: You have sudden changes in vision. You have a seizure or you are told you have had a seizure. You have new weakness or numbness on one side of your body. Your vomiting does not go away, or you cannot eat or drink without vomiting. You have trouble walking. This information is not intended to replace advice given to you by your health care provider. Make sure you discuss any questions you have with your health care provider. Document Revised: 12/18/2021 Document Reviewed: 12/18/2021 Elsevier Patient Education  2024 [employer] Dyslipidemia Dyslipidemia is an imbalance of waxy, fat-like substances (lipids) in the blood. The body needs lipids in small amounts. Dyslipidemia often involves a high level of cholesterol or triglycerides, which are types of lipids. Common forms of dyslipidemia include: High levels of LDL cholesterol. LDL is the type of cholesterol that causes fatty deposits (plaques) to build up in the blood vessels that carry blood away from the heart (arteries). Low levels of HDL cholesterol. HDL cholesterol is the type of cholesterol that protects against heart disease. High levels of HDL remove the LDL buildup from arteries. High levels of triglycerides. Triglycerides are a fatty substance in the blood that is linked to a buildup of plaques in the arteries. What are the causes? There are two main types of dyslipidemia: primary and secondary. Primary dyslipidemia is caused by changes (mutations) in genes that are passed down through families (inherited). These mutations cause several types of  dyslipidemia. Secondary dyslipidemia may be caused by various risk factors that can lead to the disease, such as lifestyle choices and certain medical conditions. What increases the risk? You are more likely to develop this condition if you are an older man or if you are a woman who has gone through menopause. Other risk factors include: Having a family history of dyslipidemia. Taking certain medicines, including birth control pills, steroids, some diuretics, and beta-blockers. Eating a diet high in saturated fat. Smoking cigarettes or excessive alcohol intake. Having certain medical conditions such as diabetes, polycystic ovary syndrome (PCOS), kidney disease, liver disease, or hypothyroidism. Not exercising regularly. Being overweight or obese with too much belly fat. What are the signs or symptoms? In most cases, dyslipidemia does not usually cause any symptoms. In severe cases, very high lipid  levels can cause: Fatty bumps under the skin (xanthomas). A white or gray ring around the black center (pupil) of the eye. Very high triglyceride levels can cause inflammation of the pancreas (pancreatitis). How is this diagnosed? Your health care provider may diagnose dyslipidemia based on a routine blood test (fasting blood test). Because most people do not have symptoms of the condition, this blood testing (lipid profile) is done on adults age 17 and older and is repeated every 4-6 years. This test checks: Total cholesterol. This measures the total amount of cholesterol in your blood, including LDL cholesterol, HDL cholesterol, and triglycerides. A healthy number is below 200 mg/dL (4.82 mmol/L). LDL cholesterol. The target number for LDL cholesterol is different for each person, depending on individual risk factors. A healthy number is usually below 100 mg/dL (7.40 mmol/L). Ask your health care provider what your LDL cholesterol should be. HDL cholesterol. An HDL level of 60 mg/dL (8.44 mmol/L) or  higher is best because it helps to protect against heart disease. A number below 40 mg/dL (8.96 mmol/L) for men or below 50 mg/dL (8.70 mmol/L) for women increases the risk for heart disease. Triglycerides. A healthy triglyceride number is below 150 mg/dL (8.30 mmol/L). If your lipid profile is abnormal, your health care provider may do other blood tests. How is this treated? Treatment depends on the type of dyslipidemia that you have and your other risk factors for heart disease and stroke. Your health care provider will have a target range for your lipid levels based on this information. Treatment for dyslipidemia starts with lifestyle changes, such as diet and exercise. Your health care provider may recommend that you: Get regular exercise. Make changes to your diet. Quit smoking if you smoke. Limit your alcohol intake. If diet changes and exercise do not help you reach your goals, your health care provider may also prescribe medicine to lower lipids. The most commonly prescribed type of medicine lowers your LDL cholesterol (statin drug). If you have a high triglyceride level, your provider may prescribe another type of drug (fibrate) or an omega-3 fish oil supplement, or both. Follow these instructions at home: Eating and drinking  Follow instructions from your health care provider or dietitian about eating or drinking restrictions. Eat a healthy diet as told by your health care provider. This can help you reach and maintain a healthy weight, lower your LDL cholesterol, and raise your HDL cholesterol. This may include: Limiting your calories, if you are overweight. Eating more fruits, vegetables, whole grains, fish, and lean meats. Limiting saturated fat, trans fat, and cholesterol. Do not drink alcohol if: Your health care provider tells you not to drink. You are pregnant, may be pregnant, or are planning to become pregnant. If you drink alcohol: Limit how much you have to: 0-1 drink a  day for women. 0-2 drinks a day for men. Know how much alcohol is in your drink. In the [place] one drink equals one 12 oz bottle of beer (355 mL), one 5 oz glass of wine (148 mL), or one 1 oz glass of hard liquor (44 mL). Activity Get regular exercise. Start an exercise and strength training program as told by your health care provider. Ask your health care provider what activities are safe for you. Your health care provider may recommend: 30 minutes of aerobic activity 4-6 days a week. Brisk walking is an example of aerobic activity. Strength training 2 days a week. General instructions Do not use any products that contain nicotine or  tobacco. These products include cigarettes, chewing tobacco, and vaping devices, such as e-cigarettes. If you need help quitting, ask your health care provider. Take over-the-counter and prescription medicines only as told by your health care provider. This includes supplements. Keep all follow-up visits. This is important. Contact a health care provider if: You are having trouble sticking to your exercise or diet plan. You are struggling to quit smoking or to control your use of alcohol. Summary Dyslipidemia often involves a high level of cholesterol or triglycerides, which are types of lipids. Treatment depends on the type of dyslipidemia that you have and your other risk factors for heart disease and stroke. Treatment for dyslipidemia starts with lifestyle changes, such as diet and exercise. Your health care provider may prescribe medicine to lower lipids. This information is not intended to replace advice given to you by your health care provider. Make sure you discuss any questions you have with your health care provider. Document Revised: 03/16/2022 Document Reviewed: 10/17/2020 Elsevier Patient Education  2025 [employer]. Preventing Vitamin D  Deficiency Vitamin D  deficiency is when your body doesn't have enough vitamin D . Vitamin D  is important because  it helps your body maintain calcium  and phosphorus levels. Vitamin D  is also important because: It helps keep bones and teeth healthy. It reduces irritation and swelling in the body (inflammation). It makes the body's defense system (immune system) strong. Our bodies make vitamin D  when our skin is exposed to direct sunlight. But for many people, this may not be enough vitamin D  to meet the body's needs. How can vitamin D  deficiency affect me? If lack of vitamin D  is really bad: Your bones may become soft. In adults, this is called osteomalacia. In children, this is called rickets. Your bones may become weak or thin. This is called osteoporosis. What can increase my risk for vitamin D  deficiency? You may be at risk for vitamin D  deficiency if: You're pregnant. You have too much body fat (obesity). You're an older adult. You have dark skin. You take medicines that affect the way vitamin D  is absorbed. You had surgery to remove a part of the stomach or a part of the small intestine. You may also be at risk if: You have a condition that makes it hard for you to absorb fat. This includes Crohn's disease, long-term (chronic) pancreatitis, or cystic fibrosis. You have certain conditions that are passed from parent to child (inherited). You don't have access to foods rich in vitamin D , or you follow a diet with little or no dairy, such as a vegan diet or lactose-free diet. You're not able to move or go outside safely. You live in areas that have fewer hours of sunlight. You spend most of your day indoors, or you cover your skin all the time when you're outdoors. What actions can I take to reduce my risk of vitamin D  deficiency? Know how much vitamin D  you need Different people need different amounts of vitamin D  daily: Infants: 400 international units (IU). Children older than 1 year: 600 IU. Adults: 600 IU. Pregnant and breastfeeding women: 600 IU. Adults older than 70 years: 800 IU. These  are the minimum levels of recommended amounts. Your health care provider may tell you to take a different amount of vitamin D  based on your needs and your health. Know the best sources of vitamin D  You can meet your daily vitamin D  needs from: Direct exposure to natural sunlight. Foods. Dietary supplements. Infants can get their vitamin D  from  infant formula. Get sun exposure Get regular, safe exposure to natural sunlight. Expose your skin to direct sunlight for at least 15 minutes every day. If you have dark skin, you may need to expose your skin for a longer period of time. Protect your skin from too much sun exposure. This helps to prevent skin cancer. Ask your provider if regular sun exposure is safe for you. Do not use a tanning bed. Follow the right diet  Eat foods that naturally contain vitamin D . These include: Beef liver. Eggs. Vitamin D  is in the yolk. Fish, such as salmon or trout. Mushrooms that were treated with UV light. Eat or drink products that have vitamin D  added to them (fortified). These may include: Cereals. Milk, including plant-based milk such as almond, soy, or oat milks. Orange juice. Margarine. When choosing foods, check the food label on the package to see: How much vitamin D  is in the item. If the food is fortified with vitamin D . The items listed above may not be all the foods and drinks that have vitamin D . Talk with an expert in healthy eating (dietitian) to learn more. Take supplements and medicines If you're at risk for vitamin D  deficiency, or if you have certain diseases, your provider may recommend that you take a vitamin D  supplement. Make sure you: Talk with your provider before you start taking any vitamin D  supplements. You may have side effects of vitamin D  supplements if you're on certain medicines or have certain medical conditions. Take your medicines and supplments only as told. Tell your provider about all the medicines you're taking.  These include vitamins, herbs, eye drops, creams, and over-the-counter medicines. To help your body to absorb vitamin D , take your supplement with a meal or snack. This information is not intended to replace advice given to you by your health care provider. Make sure you discuss any questions you have with your health care provider. Document Revised: 11/28/2023 Document Reviewed: 11/28/2023 [employer] Patient Education  2025 [employer] Sinus Infection, Adult A sinus infection, also called sinusitis, is inflammation of your sinuses. Sinuses are hollow spaces in the bones around your face. Your sinuses are located: Around your eyes. In the middle of your forehead. Behind your nose. In your cheekbones. Mucus normally drains out of your sinuses. When your nasal tissues become inflamed or swollen, mucus can become trapped or blocked. This allows bacteria, viruses, and fungi to grow, which leads to infection. Most infections of the sinuses are caused by a virus. A sinus infection can develop quickly. It can last for up to 4 weeks (acute) or for more than 12 weeks (chronic). A sinus infection often develops after a cold. What are the causes? This condition is caused by anything that creates swelling in the sinuses or stops mucus from draining. This includes: Allergies. Asthma. Infection from bacteria or viruses. Deformities or blockages in your nose or sinuses. Abnormal growths in the nose (nasal polyps). Pollutants, such as chemicals or irritants in the air. Infection from fungi. This is rare. What increases the risk? You are more likely to develop this condition if you: Have a weak body defense system (immune system). Do a lot of swimming or diving. Overuse nasal sprays. Smoke. What are the signs or symptoms? The main symptoms of this condition are pain and a feeling of pressure around the affected sinuses. Other symptoms include: Stuffy nose or congestion that makes it difficult to  breathe through your nose. Thick yellow or greenish drainage from your  nose. Tenderness, swelling, and warmth over the affected sinuses. A cough that may get worse at night. Decreased sense of smell and taste. Extra mucus that collects in the throat or the back of the nose (postnasal drip) causing a sore throat or bad breath. Tiredness (fatigue). Fever. How is this diagnosed? This condition is diagnosed based on: Your symptoms. Your medical history. A physical exam. Tests to find out if your condition is acute or chronic. This may include: Checking your nose for nasal polyps. Viewing your sinuses using a device that has a light (endoscope). Testing for allergies or bacteria. Imaging tests, such as an MRI or CT scan. In rare cases, a bone biopsy may be done to rule out more serious types of fungal sinus disease. How is this treated? Treatment for a sinus infection depends on the cause and whether your condition is chronic or acute. If caused by a virus, your symptoms should go away on their own within 10 days. You may be given medicines to relieve symptoms. They include: Medicines that shrink swollen nasal passages (decongestants). A spray that eases inflammation of the nostrils (topical intranasal corticosteroids). Rinses that help get rid of thick mucus in your nose (nasal saline washes). Medicines that treat allergies (antihistamines). Over-the-counter pain relievers. If caused by bacteria, your health care provider may recommend waiting to see if your symptoms improve. Most bacterial infections will get better without antibiotic medicine. You may be given antibiotics if you have: A severe infection. A weak immune system. If caused by narrow nasal passages or nasal polyps, surgery may be needed. Follow these instructions at home: Medicines Take, use, or apply over-the-counter and prescription medicines only as told by your health care provider. These may include nasal sprays. If  you were prescribed an antibiotic medicine, take it as told by your health care provider. Do not stop taking the antibiotic even if you start to feel better. Hydrate and humidify  Drink enough fluid to keep your urine pale yellow. Staying hydrated will help to thin your mucus. Use a cool mist humidifier to keep the humidity level in your home above 50%. Inhale steam for 10-15 minutes, 3-4 times a day, or as told by your health care provider. You can do this in the bathroom while a hot shower is running. Limit your exposure to cool or dry air. Rest Rest as much as possible. Sleep with your head raised (elevated). Make sure you get enough sleep each night. General instructions  Apply a warm, moist washcloth to your face 3-4 times a day or as told by your health care provider. This will help with discomfort. Use nasal saline washes as often as told by your health care provider. Wash your hands often with soap and water to reduce your exposure to germs. If soap and water are not available, use hand sanitizer. Do not smoke. Avoid being around people who are smoking (secondhand smoke). Keep all follow-up visits. This is important. Contact a health care provider if: You have a fever. Your symptoms get worse. Your symptoms do not improve within 10 days. Get help right away if: You have a severe headache. You have persistent vomiting. You have severe pain or swelling around your face or eyes. You have vision problems. You develop confusion. Your neck is stiff. You have trouble breathing. These symptoms may be an emergency. Get help right away. Call 911. Do not wait to see if the symptoms will go away. Do not drive yourself to the hospital. Summary A  sinus infection is soreness and inflammation of your sinuses. Sinuses are hollow spaces in the bones around your face. This condition is caused by nasal tissues that become inflamed or swollen. The swelling traps or blocks the flow of mucus.  This allows bacteria, viruses, and fungi to grow, which leads to infection. If you were prescribed an antibiotic medicine, take it as told by your health care provider. Do not stop taking the antibiotic even if you start to feel better. Keep all follow-up visits. This is important. This information is not intended to replace advice given to you by your health care provider. Make sure you discuss any questions you have with your health care provider. Document Revised: 07/18/2021 Document Reviewed: 07/18/2021 Elsevier Patient Education  2024 [employer]  The above assessment and management plan was discussed with the patient. The patient verbalized understanding of and has agreed to the management plan. Patient is aware to call the clinic if they develop any new symptoms or if symptoms persist or worsen. Patient is aware when to return to the clinic for a follow-up visit. Patient educated on when it is appropriate to go to the emergency department.   Calisha Tindel [place] Thompson, DNP [facility] [address] [place], [place] [address] [phone]

## 2024-07-27 DIAGNOSIS — H16211 Exposure keratoconjunctivitis, right eye: Secondary | ICD-10-CM | POA: Diagnosis not present

## 2024-07-27 DIAGNOSIS — H16231 Neurotrophic keratoconjunctivitis, right eye: Secondary | ICD-10-CM | POA: Diagnosis not present

## 2024-07-27 DIAGNOSIS — H53031 Strabismic amblyopia, right eye: Secondary | ICD-10-CM | POA: Diagnosis not present

## 2024-07-27 DIAGNOSIS — H04121 Dry eye syndrome of right lacrimal gland: Secondary | ICD-10-CM | POA: Diagnosis not present

## 2024-09-21 ENCOUNTER — Encounter: Payer: Self-pay | Admitting: *Deleted

## 2025-01-08 ENCOUNTER — Ambulatory Visit: Admitting: Family Medicine

## 2025-01-11 ENCOUNTER — Ambulatory Visit: Admitting: Nurse Practitioner
